# Patient Record
Sex: Male | Born: 1985 | Race: White | Hispanic: No | Marital: Single | Smoking: Current every day smoker
Health system: Southern US, Community
[De-identification: ages and names within clinical notes are randomized; demographics above are authoritative.]

## PROBLEM LIST (undated history)

## (undated) DIAGNOSIS — F952 Tourette's disorder: Secondary | ICD-10-CM

---

## 2012-02-02 ENCOUNTER — Emergency Department (HOSPITAL_COMMUNITY)
Admission: EM | Admit: 2012-02-02 | Discharge: 2012-02-02 | Disposition: A | Discharge status: Home / Self Care | Payer: Self-pay | Attending: Emergency Medicine | Admitting: Emergency Medicine

## 2012-02-02 ENCOUNTER — Emergency Department (HOSPITAL_COMMUNITY): Payer: Self-pay

## 2012-02-02 ENCOUNTER — Encounter (HOSPITAL_COMMUNITY): Payer: Self-pay | Admitting: Emergency Medicine

## 2012-02-02 DIAGNOSIS — R002 Palpitations: Secondary | ICD-10-CM | POA: Insufficient documentation

## 2012-02-02 DIAGNOSIS — R0789 Other chest pain: Secondary | ICD-10-CM | POA: Insufficient documentation

## 2012-02-02 LAB — BASIC METABOLIC PANEL
BUN: 8 mg/dL (ref 6–23)
Chloride: 106 mEq/L (ref 96–112)
GFR calc Af Amer: >90 mL/min (ref >90)
GFR calc non Af Amer: >90 mL/min (ref >90)
Glucose, Bld: 93 mg/dL (ref 70–99)
Potassium: 3.3 mEq/L — ABNORMAL LOW (ref 3.5–5.1)
Sodium: 142 mEq/L (ref 135–145)

## 2012-02-02 LAB — CBC
HCT: 47.6 % (ref 39.0–52.0)
Hemoglobin: 17.2 g/dL — ABNORMAL HIGH (ref 13.0–17.0)
WBC: 9.5 10*3/uL (ref 4.0–10.5)

## 2012-02-02 LAB — POCT I-STAT TROPONIN I

## 2012-02-02 NOTE — ED Notes (Signed)
Pt A.O. X 4. Vitals stable. NAD. Skin warm, dry, intact. Verbalized understanding of diagnosis, d/c teaching, and warning signs indicating need to seek additional treatment. Able to locate contact information to make follow up appointment. Ambulatory. No further questions at this time. Mother at bedside.

## 2012-02-02 NOTE — ED Notes (Signed)
Patient transported to X-ray 

## 2012-02-02 NOTE — ED Notes (Signed)
C/o intermittent stinging to L chest earlier today.  Reports intermittent episodes of irregular HR since Tuesday that is not associated with activity. Denies sob, nausea, vomiting, or any other symptoms.

## 2012-02-02 NOTE — ED Provider Notes (Signed)
History     CSN: [id number]  Arrival date & time 02/02/12  2007   First MD Initiated Contact with Patient 02/02/12 2123      Chief Complaint  Patient presents with  . Chest Pain    (Consider location/radiation/quality/duration/timing/severity/associated sxs/prior treatment) Patient is a 26 y.o. male presenting with chest pain. The history is provided by the patient.  Chest Pain The chest pain began 3 - 5 days ago. Chest pain occurs frequently. The chest pain is worsening. Primary symptoms include palpitations. Pertinent negatives for primary symptoms include no fever, no shortness of breath, no cough, no abdominal pain and no nausea.  The palpitations did not occur with shortness of breath.  Associated symptoms comments: He is concerned because he is having sensations of skipping beats that have become more frequent over the last 3 days. No fever, cough, SOB or chest pain. He denies cocaine use. There have been no identified aggravating factors or alleviating factors..     History reviewed. No pertinent past medical history.  History reviewed. No pertinent past surgical history.  History reviewed. No pertinent family history.  History  Substance Use Topics  . Smoking status: Current Every Day Smoker  . Smokeless tobacco: Not on file  . Alcohol Use: Yes      Review of Systems  Constitutional: Negative for fever.  HENT: Negative for neck pain.   Respiratory: Negative for cough and shortness of breath.   Cardiovascular: Positive for palpitations. Negative for chest pain.  Gastrointestinal: Negative for nausea and abdominal pain.    Allergies  Sulfa antibiotics  Home Medications  No current outpatient prescriptions on file.  BP 136/76  Pulse 65  Temp 98.3 F (36.8 C) (Oral)  Resp 20  SpO2 100%  Physical Exam  Constitutional: He is oriented to person, place, and time. He appears well-developed and well-nourished.  HENT:  Head: Normocephalic.  Neck: Normal  range of motion. Neck supple.  Cardiovascular: Normal rate and regular rhythm.   No murmur heard. Pulmonary/Chest: Effort normal and breath sounds normal. He has no wheezes. He has no rales. He exhibits no tenderness.  Abdominal: Soft. Bowel sounds are normal. There is no tenderness. There is no rebound and no guarding.  Musculoskeletal: Normal range of motion. He exhibits no edema.  Neurological: He is alert and oriented to person, place, and time.  Skin: Skin is warm and dry. No rash noted.  Psychiatric: He has a normal mood and affect.    ED Course  Procedures (including critical care time)  Labs Reviewed  CBC - Abnormal; Notable for the following:    Hemoglobin 17.2 (*)     MCHC 36.1 (*)     All other components within normal limits  BASIC METABOLIC PANEL - Abnormal; Notable for the following:    Potassium 3.3 (*)     All other components within normal limits  POCT I-STAT TROPONIN I   Results for orders placed during the hospital encounter of 02/02/12  CBC      Component Value Range   WBC 9.5  4.0 - 10.5 K/uL   RBC 5.32  4.22 - 5.81 MIL/uL   Hemoglobin 17.2 (*) 13.0 - 17.0 g/dL   HCT 65.7  84.6 - 96.2 %   MCV 89.5  78.0 - 100.0 fL   MCH 32.3  26.0 - 34.0 pg   MCHC 36.1 (*) 30.0 - 36.0 g/dL   RDW 95.2  84.1 - 32.4 %   Platelets 216  150 - 400  K/uL  BASIC METABOLIC PANEL      Component Value Range   Sodium 142  135 - 145 mEq/L   Potassium 3.3 (*) 3.5 - 5.1 mEq/L   Chloride 106  96 - 112 mEq/L   CO2 25  19 - 32 mEq/L   Glucose, Bld 93  70 - 99 mg/dL   BUN 8  6 - 23 mg/dL   Creatinine, Ser 9.81  0.50 - 1.35 mg/dL   Calcium 9.6  8.4 - 19.1 mg/dL   GFR calc non Af Amer >90  >90 mL/min   GFR calc Af Amer >90  >90 mL/min  POCT I-STAT TROPONIN I      Component Value Range   Troponin i, poc 0.00  0.00 - 0.08 ng/mL   Comment 3            Dg Chest 2 View  02/02/2012  *RADIOLOGY REPORT*  Clinical Data: Chest tightness.  Current smoker.  CHEST - 2 VIEW  Comparison: None.   Findings: Pulmonary hyperinflation which could represent emphysema or asthma or air trapping. The heart size and pulmonary vascularity are normal. The lungs appear clear and expanded without focal air space disease or consolidation. No blunting of the costophrenic angles.  No pneumothorax.  Mediastinal contours appear intact.  IMPRESSION: Pulmonary hyperinflation.  No evidence of active pulmonary disease.   Original Report Authenticated By: Marlon Pel, M.D.    Date: 02/02/2012  Rate: 79  Rhythm: normal sinus rhythm and sinus arrhythmia  QRS Axis: normal  Intervals: normal  ST/T Wave abnormalities: normal  Conduction Disutrbances:none  Narrative Interpretation:   Old EKG Reviewed: none available   No results found.   No diagnosis found.  1. Palpitations.  MDM  Normal EKG, clear CXR. Will refer to cardiology to consider benefits of monitoring.         Rodena Medin, PA-C 02/02/12 2238

## 2012-02-03 NOTE — ED Provider Notes (Signed)
Medical screening examination/treatment/procedure(s) were performed by non-physician practitioner and as supervising physician I was immediately available for consultation/collaboration.  Juliet Rude. Rubin Payor, MD 02/03/12 [phone]

## 2013-02-17 ENCOUNTER — Emergency Department (HOSPITAL_COMMUNITY)
Admission: EM | Admit: 2013-02-17 | Discharge: 2013-02-17 | Disposition: A | Discharge status: Home / Self Care | Payer: Self-pay | Attending: Emergency Medicine | Admitting: Emergency Medicine

## 2013-02-17 ENCOUNTER — Encounter (HOSPITAL_COMMUNITY): Payer: Self-pay | Admitting: Emergency Medicine

## 2013-02-17 DIAGNOSIS — K047 Periapical abscess without sinus: Secondary | ICD-10-CM | POA: Insufficient documentation

## 2013-02-17 DIAGNOSIS — K002 Abnormalities of size and form of teeth: Secondary | ICD-10-CM | POA: Insufficient documentation

## 2013-02-17 DIAGNOSIS — F17200 Nicotine dependence, unspecified, uncomplicated: Secondary | ICD-10-CM | POA: Insufficient documentation

## 2013-02-17 DIAGNOSIS — K029 Dental caries, unspecified: Secondary | ICD-10-CM | POA: Insufficient documentation

## 2013-02-17 MED ORDER — HYDROCODONE-ACETAMINOPHEN 5-325 MG PO TABS: 1.0000 | ORAL_TABLET | ORAL | Status: DC | PRN

## 2013-02-17 MED ORDER — PENICILLIN V POTASSIUM 500 MG PO TABS: 500.0000 mg | ORAL_TABLET | Freq: Four times a day (QID) | ORAL | Status: AC

## 2013-02-17 NOTE — ED Provider Notes (Signed)
Medical screening examination/treatment/procedure(s) were performed by non-physician practitioner and as supervising physician I was immediately available for consultation/collaboration.  EKG Interpretation   None         Natonya Finstad H Ryelee Albee, MD 02/17/13 2234 

## 2013-02-17 NOTE — ED Provider Notes (Signed)
CSN: [id number]     Arrival date & time 02/17/13  1912 History  This chart was scribed for non-physician practitioner, Sharilyn Sites, PA-C working with Richardean Canal, MD by Greggory Stallion, ED scribe. This patient was seen in room TR07C/TR07C and the patient's care was started at 8:07 PM.   Chief Complaint  Patient presents with  . Dental Pain   The history is provided by the patient. No language interpreter was used.   HPI Comments: Joshua Lin is a 27 y.o. male who presents to the Emergency Department complaining of right upper dental pain that started 3 days ago. Pt started seeing mild swelling to right upper face yesterday but it has worsened since then. He has taken ibuprofen with no relief. Pt states chewing worsens the pain. He denies fever, chills, sweats, or difficulty swallowing.  Pt does not currently have a dentist.  History reviewed. No pertinent past medical history. History reviewed. No pertinent past surgical history. No family history on file. History  Substance Use Topics  . Smoking status: Current Every Day Smoker  . Smokeless tobacco: Not on file  . Alcohol Use: Yes    Review of Systems  Constitutional: Negative for fever, chills and diaphoresis.  HENT: Positive for dental problem and facial swelling. Negative for trouble swallowing.   All other systems reviewed and are negative.    Allergies  Sulfa antibiotics  Home Medications  No current outpatient prescriptions on file.  BP 131/77  Pulse 96  Temp(Src) 98.2 F (36.8 C) (Oral)  Resp 16  Wt 152 lb 3.2 oz (69.037 kg)  SpO2 98%  Physical Exam  Nursing note and vitals reviewed. Constitutional: He is oriented to person, place, and time. He appears well-developed and well-nourished. No distress.  HENT:  Head: Normocephalic and atraumatic.  Mouth/Throat: Uvula is midline, oropharynx is clear and moist and mucous membranes are normal. No oral lesions. No trismus in the jaw. Abnormal dentition. Dental caries  present. No oropharyngeal exudate, posterior oropharyngeal edema, posterior oropharyngeal erythema or tonsillar abscesses.    Teeth largely in poor dentition, right upper molars with extensive cavities, surrounding gingiva swollen and erythematous, some swelling of face along right maxilla, handling secretions appropriately, no trismus  Eyes: Conjunctivae and EOM are normal. Pupils are equal, round, and reactive to light.  Neck: Normal range of motion.  Cardiovascular: Normal rate, regular rhythm and normal heart sounds.   Pulmonary/Chest: Effort normal and breath sounds normal. No respiratory distress. He has no wheezes.  Musculoskeletal: Normal range of motion.  Neurological: He is alert and oriented to person, place, and time.  Skin: Skin is warm and dry. He is not diaphoretic.  Psychiatric: He has a normal mood and affect.    ED Course  Procedures (including critical care time)  DIAGNOSTIC STUDIES: Oxygen Saturation is 98% on RA, normal by my interpretation.    COORDINATION OF CARE: 8:10 PM-Discussed treatment plan which includes antibiotics and pain medication with pt at bedside and pt agreed to plan. Advised pt to follow up with a dentist.   Labs Review Labs Reviewed - No data to display Imaging Review No results found.  EKG Interpretation   None       MDM   1. Dental abscess    Dental pain with signs of dental abscess. Airway patent, no difficulty handling secretions.  Patient will be started on amoxicillin and Vicodin. Followup with dentist, referrals given. Discussed plan with patient, he agreed. Return precautions advised.  I personally performed the  services described in this documentation, which was scribed in my presence. The recorded information has been reviewed and is accurate.   Garlon Hatchet, PA-C 02/17/13 2132

## 2013-02-17 NOTE — ED Notes (Signed)
Patient presents with right upper molar pain since yesterday and swelling to gingival area on right upper side this AM. Patient has not seen a dentist.  Denies SOB. Reports taking ibuprofen several hours ago without relief.

## 2013-09-13 ENCOUNTER — Emergency Department (HOSPITAL_COMMUNITY): Payer: No Typology Code available for payment source

## 2013-09-13 ENCOUNTER — Emergency Department (HOSPITAL_COMMUNITY)
Admission: EM | Admit: 2013-09-13 | Discharge: 2013-09-13 | Disposition: A | Discharge status: Home / Self Care | Payer: No Typology Code available for payment source | Attending: Emergency Medicine | Admitting: Emergency Medicine

## 2013-09-13 ENCOUNTER — Other Ambulatory Visit (HOSPITAL_COMMUNITY): Payer: No Typology Code available for payment source

## 2013-09-13 ENCOUNTER — Encounter (HOSPITAL_COMMUNITY): Payer: Self-pay | Admitting: Emergency Medicine

## 2013-09-13 DIAGNOSIS — F17200 Nicotine dependence, unspecified, uncomplicated: Secondary | ICD-10-CM | POA: Insufficient documentation

## 2013-09-13 DIAGNOSIS — K5900 Constipation, unspecified: Secondary | ICD-10-CM | POA: Insufficient documentation

## 2013-09-13 DIAGNOSIS — R82998 Other abnormal findings in urine: Secondary | ICD-10-CM | POA: Insufficient documentation

## 2013-09-13 DIAGNOSIS — R630 Anorexia: Secondary | ICD-10-CM | POA: Insufficient documentation

## 2013-09-13 LAB — CBC WITH DIFFERENTIAL/PLATELET
BASOS PCT: 0 % (ref 0–1)
Basophils Absolute: 0 10*3/uL (ref 0.0–0.1)
EOS ABS: 0 10*3/uL (ref 0.0–0.7)
Eosinophils Relative: 0 % (ref 0–5)
HCT: 43.6 % (ref 39.0–52.0)
HEMOGLOBIN: 15.6 g/dL (ref 13.0–17.0)
Lymphocytes Relative: 12 % (ref 12–46)
Lymphs Abs: 1.3 10*3/uL (ref 0.7–4.0)
MCH: 31.8 pg (ref 26.0–34.0)
MCHC: 35.8 g/dL (ref 30.0–36.0)
MCV: 88.8 fL (ref 78.0–100.0)
MONO ABS: 0.8 10*3/uL (ref 0.1–1.0)
MONOS PCT: 8 % (ref 3–12)
NEUTROS PCT: 80 % — AB (ref 43–77)
Neutro Abs: 8.4 10*3/uL — ABNORMAL HIGH (ref 1.7–7.7)
Platelets: 204 10*3/uL (ref 150–400)
RBC: 4.91 MIL/uL (ref 4.22–5.81)
RDW: 12.1 % (ref 11.5–15.5)
WBC: 10.6 10*3/uL — ABNORMAL HIGH (ref 4.0–10.5)

## 2013-09-13 LAB — BASIC METABOLIC PANEL
BUN: 10 mg/dL (ref 6–23)
CALCIUM: 9.3 mg/dL (ref 8.4–10.5)
CO2: 24 mEq/L (ref 19–32)
CREATININE: 0.89 mg/dL (ref 0.50–1.35)
Chloride: 101 mEq/L (ref 96–112)
Glucose, Bld: 86 mg/dL (ref 70–99)
POTASSIUM: 3.6 meq/L — AB (ref 3.7–5.3)
Sodium: 141 mEq/L (ref 137–147)

## 2013-09-13 LAB — URINALYSIS, ROUTINE W REFLEX MICROSCOPIC
Glucose, UA: NEGATIVE mg/dL
Hgb urine dipstick: NEGATIVE
Leukocytes, UA: NEGATIVE
NITRITE: NEGATIVE
Protein, ur: NEGATIVE mg/dL
Specific Gravity, Urine: 1.03 (ref 1.005–1.030)
UROBILINOGEN UA: 1 mg/dL (ref 0.0–1.0)
pH: 6 (ref 5.0–8.0)

## 2013-09-13 NOTE — ED Notes (Addendum)
Pt brought back to room with family in tow; pt getting undressed and into a gown at this time; family at bedside; Felipa Eth, RN aware

## 2013-09-13 NOTE — ED Provider Notes (Signed)
CSN: [id number]     Arrival date & time 09/13/13  1814 History   First MD Initiated Contact with Patient 09/13/13 1838     Chief Complaint  Patient presents with  . Abdominal Pain     (Consider location/radiation/quality/duration/timing/severity/associated sxs/prior Treatment) Patient is a 28 y.o. male presenting with abdominal pain. The history is provided by the patient and a relative.  Abdominal Pain  He has had intermittent, crampy, abdominal pain, for one week. He has had decreased stooling during this time. He has anorexia without nausea or vomiting. His urine was dark in color today. He is not using anything, to help the symptoms. He has never had this previously There no other known modifying factors.  History reviewed. No pertinent past medical history. History reviewed. No pertinent past surgical history. No family history on file. History  Substance Use Topics  . Smoking status: Current Every Day Smoker  . Smokeless tobacco: Not on file  . Alcohol Use: Yes    Review of Systems  Gastrointestinal: Positive for abdominal pain.  All other systems reviewed and are negative.     Allergies  Sulfa antibiotics  Home Medications   Prior to Admission medications   Medication Sig Start Date End Date Taking? Authorizing Provider  acetaminophen (TYLENOL) 500 MG tablet Take 1,000 mg by mouth every 6 (six) hours as needed for mild pain.   Yes Historical Provider, MD   BP 109/76  Pulse 74  Temp(Src) 98.1 F (36.7 C) (Oral)  Resp 16  Wt 155 lb (70.308 kg)  SpO2 99% Physical Exam  Nursing note and vitals reviewed. Constitutional: He is oriented to person, place, and time. He appears well-developed and well-nourished.  HENT:  Head: Normocephalic and atraumatic.  Right Ear: External ear normal.  Left Ear: External ear normal.  Eyes: Conjunctivae and EOM are normal. Pupils are equal, round, and reactive to light.  Neck: Normal range of motion and phonation normal. Neck  supple.  Cardiovascular: Normal rate, regular rhythm, normal heart sounds and intact distal pulses.   Pulmonary/Chest: Effort normal and breath sounds normal. He exhibits no bony tenderness.  Abdominal: Soft. He exhibits no mass. There is tenderness (mild bilateral lower quadrant tenderness.). There is no rebound and no guarding.  Musculoskeletal: Normal range of motion.  Neurological: He is alert and oriented to person, place, and time. No cranial nerve deficit or sensory deficit. He exhibits normal muscle tone. Coordination normal.  Skin: Skin is warm, dry and intact.  Psychiatric: He has a normal mood and affect. His behavior is normal. Judgment and thought content normal.    ED Course  Procedures (including critical care time) 18:50- discussed the findings with the patient and his mother. He, clinically has constipation. He would like to have some testing done for reassurance prior to being dispositioned.  Medications - No data to display  No data found.   8:11 PM Reevaluation with update and discussion. After initial assessment and treatment, an updated evaluation reveals He remains comfortable. Imaging pending.Flint Melter     Labs Review Labs Reviewed  CBC WITH DIFFERENTIAL - Abnormal; Notable for the following:    WBC 10.6 (*)    Neutrophils Relative % 80 (*)    Neutro Abs 8.4 (*)    All other components within normal limits  BASIC METABOLIC PANEL - Abnormal; Notable for the following:    Potassium 3.6 (*)    All other components within normal limits  URINALYSIS, ROUTINE W REFLEX MICROSCOPIC - Abnormal;  Notable for the following:    Color, Urine AMBER (*)    Bilirubin Urine SMALL (*)    Ketones, ur >80 (*)    All other components within normal limits    Imaging Review Dg Abd 1 View  09/13/2013   CLINICAL DATA:  Abdominal pain 6 days  EXAM: ABDOMEN - 1 VIEW  COMPARISON:  None.  FINDINGS: No dilated loops of large or small bowel. There is gas in the rectum. No  pathologic calcifications. No organomegaly.  IMPRESSION: No abdominal abnormality by radiograph.   Electronically Signed   By: Genevive BiStewart  Edmunds M.D.   On: 09/13/2013 20:50     EKG Interpretation None      MDM   Final diagnoses:  Constipation    Eval c/w clinical constipation. Doubt Colitis, SBI or metabolic instability.   Nursing Notes Reviewed/ Care Coordinated Applicable Imaging Reviewed Interpretation of Laboratory Data incorporated into ED treatment  The patient appears reasonably screened and/or stabilized for discharge and I doubt any other medical condition or other [facility] requiring further screening, evaluation, or treatment in the ED at this time prior to discharge.  Plan: Home Medications- Miralax; Home Treatments- rest; return here if the recommended treatment, does not improve the symptoms; Recommended follow up- PCP of choice prn    Flint MelterElliott L Maysen Sudol, MD 09/15/13 1123

## 2013-09-13 NOTE — ED Notes (Signed)
The pt has had generalized abd pain since last Sunday no n v or diarrhea.  The pain went away then returned at the end of this week

## 2013-09-13 NOTE — Discharge Instructions (Signed)
Use MiraLax once or twice a day, for one or 2 weeks. Increase the amount of fiber in your diet. Drink plenty of water each day (30-40 ounces).     Constipation, Adult Constipation is when a person has fewer than 3 bowel movements a week; has difficulty having a bowel movement; or has stools that are dry, hard, or larger than normal. As people grow older, constipation is more common. If you try to fix constipation with medicines that make you have a bowel movement (laxatives), the problem may get worse. Long-term laxative use may cause the muscles of the colon to become weak. A low-fiber diet, not taking in enough fluids, and taking certain medicines may make constipation worse. CAUSES   Certain medicines, such as antidepressants, pain medicine, iron supplements, antacids, and water pills.   Certain diseases, such as diabetes, irritable bowel syndrome (IBS), thyroid disease, or depression.   Not drinking enough water.   Not eating enough fiber-rich foods.   Stress or travel.  Lack of physical activity or exercise.  Not going to the restroom when there is the urge to have a bowel movement.  Ignoring the urge to have a bowel movement.  Using laxatives too much. SYMPTOMS   Having fewer than 3 bowel movements a week.   Straining to have a bowel movement.   Having hard, dry, or larger than normal stools.   Feeling full or bloated.   Pain in the lower abdomen.  Not feeling relief after having a bowel movement. DIAGNOSIS  Your caregiver will take a medical history and perform a physical exam. Further testing may be done for severe constipation. Some tests may include:   A barium enema X-ray to examine your rectum, colon, and sometimes, your small intestine.  A sigmoidoscopy to examine your lower colon.  A colonoscopy to examine your entire colon. TREATMENT  Treatment will depend on the severity of your constipation and what is causing it. Some dietary treatments  include drinking more fluids and eating more fiber-rich foods. Lifestyle treatments may include regular exercise. If these diet and lifestyle recommendations do not help, your caregiver may recommend taking over-the-counter laxative medicines to help you have bowel movements. Prescription medicines may be prescribed if over-the-counter medicines do not work.  HOME CARE INSTRUCTIONS   Increase dietary fiber in your diet, such as fruits, vegetables, whole grains, and beans. Limit high-fat and processed sugars in your diet, such as [place] fries, hamburgers, cookies, candies, and soda.   A fiber supplement may be added to your diet if you cannot get enough fiber from foods.   Drink enough fluids to keep your urine clear or pale yellow.   Exercise regularly or as directed by your caregiver.   Go to the restroom when you have the urge to go. Do not hold it.  Only take medicines as directed by your caregiver. Do not take other medicines for constipation without talking to your caregiver first. SEEK IMMEDIATE MEDICAL CARE IF:   You have bright red blood in your stool.   Your constipation lasts for more than 4 days or gets worse.   You have abdominal or rectal pain.   You have thin, pencil-like stools.  You have unexplained weight loss. MAKE SURE YOU:   Understand these instructions.  Will watch your condition.  Will get help right away if you are not doing well or get worse. Document Released: 12/31/2003 Document Revised: 06/26/2011 Document Reviewed: 01/13/2013 [facility] Patient Information 2014 [place], [place].    Fiber  Content in Foods Drinking plenty of fluids and consuming foods high in fiber can help with constipation. See the list below for the fiber content of some common foods. Starches and Grains / Dietary Fiber (g)  Cheerios, 1 cup / 3 g  Kellogg's Corn Flakes, 1 cup / 0.7 g  Rice Krispies, 1  cup / 0.3 g  Quaker Oat Life Cereal,  cup / 2.1 g  Oatmeal,  instant (cooked),  cup / 2 g  Kellogg's Frosted Mini Wheats, 1 cup / 5.1 g  Rice, brown, long-grain (cooked), 1 cup / 3.5 g  Rice, white, long-grain (cooked), 1 cup / 0.6 g  Macaroni, cooked, enriched, 1 cup / 2.5 g Legumes / Dietary Fiber (g)  Beans, baked, canned, plain or vegetarian,  cup / 5.2 g  Beans, kidney, canned,  cup / 6.8 g  Beans, pinto, dried (cooked),  cup / 7.7 g  Beans, pinto, canned,  cup / 5.5 g Breads and Crackers / Dietary Fiber (g)  Graham crackers, plain or honey, 2 squares / 0.7 g  Saltine crackers, 3 squares / 0.3 g  Pretzels, plain, salted, 10 pieces / 1.8 g  Bread, whole-wheat, 1 slice / 1.9 g  Bread, white, 1 slice / 0.7 g  Bread, raisin, 1 slice / 1.2 g  Bagel, plain, 3 oz / 2 g  Tortilla, flour, 1 oz / 0.9 g  Tortilla, corn, 1 small / 1.5 g  Bun, hamburger or hotdog, 1 small / 0.9 g Fruits / Dietary Fiber (g)  Apple, raw with skin, 1 medium / 4.4 g  Applesauce, sweetened,  cup / 1.5 g  Banana,  medium / 1.5 g  Grapes, 10 grapes / 0.4 g  Orange, 1 small / 2.3 g  Raisin, 1.5 oz / 1.6 g  Melon, 1 cup / 1.4 g Vegetables / Dietary Fiber (g)  Green beans, canned,  cup / 1.3 g  Carrots (cooked),  cup / 2.3 g  Broccoli (cooked),  cup / 2.8 g  Peas, frozen (cooked),  cup / 4.4 g  Potatoes, mashed,  cup / 1.6 g  Lettuce, 1 cup / 0.5 g  Corn, canned,  cup / 1.6 g  Tomato,  cup / 1.1 g Document Released: 08/20/2006 Document Revised: 06/26/2011 Document Reviewed: 10/15/2006 ExitCare Patient Information 2014 [place], [place].

## 2013-09-13 NOTE — ED Provider Notes (Signed)
2100 - Patient care assumed form Dr. Effie Shy at 2000 at shift change. Imaging pending to evaluate for constipation. Symptoms include generalized abdominal cramping and decreased BMs. Plan discussed with Dr. Effie Shy which includes discharge with PCP f/u if imaging non-acute. Imaging reviewed which shows no dilated bowel loops. No organomegaly. Labs reviewed which are reassuring. Patient stable for d/c with instruction to add Miralax, increase his daily fiber and f/u with his PCP. Patient agreeable to plan with no unaddressed concerns.  Filed Vitals:   09/13/13 1942 09/13/13 1955 09/13/13 2000 09/13/13 2050  BP: 113/70 114/74 109/65 107/65  Pulse: 77 79 81 75  Temp: 97.9 F (36.6 C)     TempSrc: Oral     Resp: 18 17  16   Weight:      SpO2: 98% 97% 97% 97%   Dg Abd 1 View  09/13/2013   CLINICAL DATA:  Abdominal pain 6 days  EXAM: ABDOMEN - 1 VIEW  COMPARISON:  None.  FINDINGS: No dilated loops of large or small bowel. There is gas in the rectum. No pathologic calcifications. No organomegaly.  IMPRESSION: No abdominal abnormality by radiograph.   Electronically Signed   By: Genevive Bi M.D.   On: 09/13/2013 20:50      Antony Madura, PA-C 09/13/13 2100

## 2013-09-14 NOTE — ED Provider Notes (Signed)
Pt of Dr Neoma Laming, MD 09/14/13 [phone]

## 2014-02-16 ENCOUNTER — Emergency Department (HOSPITAL_COMMUNITY)
Admission: EM | Admit: 2014-02-16 | Discharge: 2014-02-16 | Disposition: A | Discharge status: Home / Self Care | Payer: No Typology Code available for payment source | Attending: Emergency Medicine | Admitting: Emergency Medicine

## 2014-02-16 ENCOUNTER — Encounter (HOSPITAL_COMMUNITY): Payer: Self-pay | Admitting: Emergency Medicine

## 2014-02-16 DIAGNOSIS — M436 Torticollis: Secondary | ICD-10-CM | POA: Diagnosis not present

## 2014-02-16 DIAGNOSIS — R200 Anesthesia of skin: Secondary | ICD-10-CM | POA: Insufficient documentation

## 2014-02-16 DIAGNOSIS — Z720 Tobacco use: Secondary | ICD-10-CM | POA: Diagnosis not present

## 2014-02-16 DIAGNOSIS — M542 Cervicalgia: Secondary | ICD-10-CM | POA: Diagnosis present

## 2014-02-16 DIAGNOSIS — M5412 Radiculopathy, cervical region: Secondary | ICD-10-CM | POA: Diagnosis not present

## 2014-02-16 HISTORY — DX: Tourette's disorder: F95.2

## 2014-02-16 MED ORDER — NAPROXEN 500 MG PO TABS: 500.0000 mg | ORAL_TABLET | Freq: Two times a day (BID) | ORAL | Status: DC

## 2014-02-16 MED ORDER — HYDROCODONE-ACETAMINOPHEN 5-325 MG PO TABS: 1.0000 | ORAL_TABLET | Freq: Four times a day (QID) | ORAL | Status: DC | PRN

## 2014-02-16 NOTE — ED Provider Notes (Signed)
CSN: [id number]     Arrival date & time 02/16/14  1736 History  This chart was scribed for a non-physician practitioner, Mellody DrownLauren Marcella Charlson, PA-C working with Warnell Foresterrey Wofford, MD by [place]azil[place] Peace, ED Scribe. The patient was seen in TR05C/TR05C. The patient's care was started at 8:15 PM.     Chief Complaint  Patient presents with  . Neck Pain  . Arm Pain      HPI Comments: Joshua Lin is a 28 y.o. male who presents to the Emergency Department complaining of neck pain and stiffness onset 2 weeks. Pt states he has been dealing with problem over past 1 year but explains pain has gotten worse. He reports pain radiates into his his right arm and experience numbness. He notes taking Ibuprofen, intermittently to address symptoms which he states had been effective in the past but has not worked since recent flare up. He denies any neck injuries to his knowledge. Pt believes history of Tourette's and "twitching" may be responsible for current discomfort. Pt is right dominant. Pt does not have PCP.   Patient is a 28 y.o. male presenting with neck pain and arm pain. The history is provided by the patient. No language interpreter was used.  Neck Pain Associated symptoms: numbness   Associated symptoms: no weakness   Arm Pain      Past Medical History  Diagnosis Date  . Tourette disease    History reviewed. No pertinent past surgical history. History reviewed. No pertinent family history. History  Substance Use Topics  . Smoking status: Current Every Day Smoker  . Smokeless tobacco: Not on file  . Alcohol Use: Yes    Review of Systems  Musculoskeletal: Positive for neck pain and neck stiffness.  Skin: Negative for color change and wound.  Neurological: Positive for numbness. Negative for weakness.      Allergies  Sulfa antibiotics  Home Medications   Prior to Admission medications   Medication Sig Start Date End Date Taking? Authorizing Provider  acetaminophen (TYLENOL) 500 MG tablet  Take 1,000 mg by mouth every 6 (six) hours as needed for mild pain.    Historical Provider, MD   BP 107/78 mmHg  Pulse 72  Temp(Src) 98.2 F (36.8 C) (Oral)  Resp 18  Ht 6\' 2"  (1.88 m)  Wt 160 lb (72.576 kg)  BMI 20.53 kg/m2  SpO2 99% Physical Exam  Constitutional: He is oriented to person, place, and time. He appears well-developed and well-nourished. No distress.  HENT:  Head: Normocephalic and atraumatic.  Eyes: Conjunctivae and EOM are normal.  Neck: Neck supple. No spinous process tenderness present.  Cardiovascular: Normal rate and regular rhythm.   Pulses:      Radial pulses are 2+ on the right side, and 2+ on the left side.  Pulmonary/Chest: Effort normal. No respiratory distress.  Musculoskeletal: Normal range of motion.       Arms: Positive Spurling's test on the right. Negative Shoulder Abduction test. Normals sensation and equal grip strength bilaterally.   Neurological: He is alert and oriented to person, place, and time.  Skin: Skin is warm and dry.  Psychiatric: He has a normal mood and affect. His behavior is normal.  Nursing note and vitals reviewed.   ED Course  Procedures (including critical care time) Labs Review Labs Reviewed - No data to display  Imaging Review No results found.   EKG Interpretation None     Medications - No data to display  8:20 PM- Treatment plan was discussed with  patient and patient's grandmother who verbalizes understanding and agrees.   MDM   Final diagnoses:  Cervical radicular pain   Patient with non-traumatic right trapezius and neck pain for over one year, worsening. Neurovascularly intact. Positive Spurling's test, likely radicular pain. Plan to treat with anti-inflammatories, narcotic pain medication and follow-up with a specialist. Meds given in ED:  Medications - No data to display  Discharge Medication List as of 02/16/2014  8:23 PM    START taking these medications   Details  HYDROcodone-acetaminophen  (NORCO/VICODIN) 5-325 MG per tablet Take 1 tablet by mouth every 6 (six) hours as needed., Starting 02/16/2014, Until Discontinued, Print    naproxen (NAPROSYN) 500 MG tablet Take 1 tablet (500 mg total) by mouth 2 (two) times daily., Starting 02/16/2014, Until Discontinued, Print        I personally performed the services described in this documentation, which was scribed in my presence. The recorded information has been reviewed and is accurate.   Mellody DrownLauren Mccall Will, PA-C 02/16/14 2250  Warnell Foresterrey Wofford, MD 02/19/14 [phone]

## 2014-02-16 NOTE — ED Notes (Addendum)
Pt sts neck and right sided upper back pain with radiation to right arm x 1 year that is worse over last 2 weeks; pt sts hx of tourettes and thinks is causing spasm

## 2014-02-16 NOTE — ED Notes (Signed)
Pt A&OX4, ambulatory at d/c with steady gait, NAD 

## 2014-02-16 NOTE — Discharge Instructions (Signed)
Call for a follow up appointment with a Family or Primary Care Provider.  °Return if Symptoms worsen.   °Take medication as prescribed.  °Do not operate heavy machinery or drink alcohol while taking narcotic pain medication. ° ° °Emergency Department Resource Guide °1) Find a Doctor and Pay Out of Pocket °Although you won't have to find out who is covered by your insurance plan, it is a good idea to ask around and get recommendations. You will then need to call the office and see if the doctor you have chosen will accept you as a new patient and what types of options they offer for patients who are self-pay. Some doctors offer discounts or will set up payment plans for their patients who do not have insurance, but you will need to ask so you aren't surprised when you get to your appointment. ° °2) Contact Your Local Health Department °Not all health departments have doctors that can see patients for sick visits, but many do, so it is worth a call to see if yours does. If you don't know where your local health department is, you can check in your phone book. The CDC also has a tool to help you locate your state's health department, and many state websites also have listings of all of their local health departments. ° °3) Find a Walk-in Clinic °If your illness is not likely to be very severe or complicated, you may want to try a walk in clinic. These are popping up all over the country in pharmacies, drugstores, and shopping centers. They're usually staffed by nurse practitioners or physician assistants that have been trained to treat common illnesses and complaints. They're usually fairly quick and inexpensive. However, if you have serious medical issues or chronic medical problems, these are probably not your best option. ° °No Primary Care Doctor: °- Call Health Connect at  [phone] - they can help you locate a primary care doctor that  accepts your insurance, provides certain services, etc. °- Physician Referral  Service- 1-800-533-3463 ° °Chronic Pain Problems: °Organization         Address  Phone   Notes  °[facility]  ([phone] Patients need to be referred by their primary care doctor.  ° °Medication Assistance: °Organization         Address  Phone   Notes  °[place] [facility] [address] °[place], [place] [address] [phone]) [phone] --Must be a resident of [place] °-- Must have NO insurance coverage whatsoever (no Medicaid/ Medicare, etc.) °-- The pt. MUST have a primary care doctor that directs their care regularly and follows them in the community °  °[facility]ist  (866) 331-1348   °United Way  (888) 892-1162   ° °Agencies that provide inexpensive medical care: °Organization         Address  Phone   Notes  °[facility]  ([phone]) [phone]   °[facility]    ([phone]) [phone]   °[facility] [address] °[place], [place] [address] [phone]) [phone]   °[facility] [address], °[place] ([phone]) [phone]   °Planned Parenthood    (336) 373-0678   °[facility]    (336) 272-1050   °[facility] ° [address], [place] Phone:  (336) [phone], Fax:  ([phone] Hours of Operation:  9 am - 6 pm, M-F.  Also accepts Medicaid/Medicare and self-pay.  °  [place] for [place], [address], [place] Phone: [phone], Fax: [phone]. Hours of Operation:  8:30 am - 5:30 pm, M-F.  Also accepts Medicaid and self-pay.  [facility] [address] [place] Phone: [phone]   [place], [place], [place] [phone], Ext. [phone] Mondays & Thursdays: 7-9 AM.  First 15 patients are seen on a first come, first serve basis.    [place] Providers:  Organization         Address  Phone   Notes  [facility] [address], [place] [phone] Also accepts self-pay patients.  [facility] [address], [place]  [phone]   [place], [address], [place] [phone]   [facility] [address] [place] [phone]   Lucianne Lei [address], [place]   [phone] Only accepts [place] Access [place] patients after they have their name applied to their card.   Self-Pay (no insurance) in [facility]:  Organization         Address  Phone   Notes  Sickle Cell Patients, [facility] [place] [phone]   [facility] [place] [phone]   [facility] [place]  [place], [place], [place] [phone]   [facility]/Dr. Osei-Bonsu  [address], [place] or [address], [place] [phone] Phone number for both [place] and [place] locations is the same.  Urgent Medical and [facility] [address] [place] [phone]   [facility] [address] [place] or [address] [phone]   [facility] [address], [place] [phone], phone; [phone], fax Sees patients 1st and 3rd Saturday of every month.  Must not qualify for public or private insurance (i.e. Medicaid, Medicare, [place] Health Choice, Veterans' Benefits)  Household income should be no more than 200% of the poverty level The clinic cannot treat you if you are pregnant or think you are pregnant  Sexually transmitted diseases are not treated at the clinic.    Dental Care: Organization         Address  Phone  Notes  [facility] of [facility] [place] [phone] Accepts children up to age 23 who are enrolled in  [place] or [place]; pregnant women with a Medicaid card; and children who have applied for Medicaid or [place] Health Choice, but were declined, whose parents can pay a reduced fee at time of service.  [facility] Department of [facility]  [address], [place] [phone] Accepts children up to age 30 who are enrolled in [place] or [place]; pregnant women with a Medicaid card; and children who have applied for Medicaid or [place] Health Choice, but were declined, whose parents can pay a reduced fee at time of service.  [facility]  [place] [phone] Patients are seen by appointment only. Walk-ins are not accepted. [place] will see patients 70 years of age and older. Monday - Tuesday (8am-5pm) Most Wednesdays (8:30-5pm) $30 per visit,  cash only  [employer] [facility] [employer] PRO[facility]  [address], [place] [phone] Patients are seen by appointment only. Walk-ins are not accepted. [place] will see patients 68 years of age and older. One Wednesday Evening (Monthly: Volunteer Based).  $30 per visit, cash only  [place]  [phone] for adults; Children under age 32, call Graduate Pediatric Dentistry at [phone]. Children aged 36-14, please call [phone] to request a pediatric application.  Dental services are provided in all areas of dental care including fillings, crowns and bridges, complete and partial dentures, implants, gum treatment, root canals, and extractions. Preventive care is also provided. Treatment is provided to both adults and children. Patients are selected via a lottery and there is often a waiting list.   [facility] [address] [place]  ([phone] [email or web address]   [facility] [address] [place], [place] [phone], Ext. [phone] Second and Fourth Thursday of each month, opens at 6:30  AM; Clinic ends at 9 AM.  Patients are seen on a first-come first-served basis, and a limited number are seen during each clinic.   [facility]  [address] [place] Danker [place], [place] [phone]   Eligibility Requirements You must have lived in [place], [place], or [place] counties for at least the last three months.   You cannot be eligible for state or federal sponsored [employer], including [employer], [place], or [employer].   You generally cannot be eligible for healthcare insurance through your employer.    How to apply: Eligibility screenings are held every Tuesday and Wednesday afternoon from 1:00 pm until 4:00 pm. You do not need an appointment for the interview!  [facility] [address] [place], [place]   [place]  [place] Department  [place]  [phone]    Behavioral Health Resources in the Community: Intensive Outpatient Programs Organization         Address  Phone  Notes  [place]jou Flanagan. [address], [place], [place] [phone]   [facility] Outpatient [address] [place], [place]   ADS: [facility] [address] [place], [place]   [place] [address],  [place], [place] or [phone]   Substance Abuse Resources Organization         Address  Phone  Notes  Alcohol and Drug Services  8256994078   [place]  [phone]   [facility] [place]   Chinita Pester  [phone]   Residential & Outpatient Substance Abuse Program  [phone]   Psychological Services Organization         Address  Phone  Notes  [facility] [place]  [place]  [phone]   [place] [address] [place] or  [phone]    Mobile Crisis Teams Organization         Address  Phone  Notes  [facility], Mobile Crisis Care Unit  [phone]   [facility]  [address] [place], [place]   [place] [address], [place] [phone]    Self-Help/Support Groups Organization         Address  Phone  Notes  [facility] [place] - variety of support groups  [place] Call for more information  Narcotics Anonymous (NA), [facility]aring [facility] [address], [employer] [place]  2 meetings at this location   Special educational needs teacher         Address  Phone  Notes  [facility] [place],    [place]  [phone]   [facility]  [address] [place] [phone], [place], [place]   [facility] [place], [place] [phone] Admissions: 8am-3pm M-F  Incentives Substance [place] [address]    [place], [place] [phone]   [facility] [address] [place], [place], [place]   [facility] [address],  [place], [place]   [facility] - Intensive Outpatient [address] Kristeen Mans [address], [place], [place]   [facility] ([place] [address].,  [place], [place] [phone] or [phone]   [facility] ([facility]S) [address], [place], [place] Accepts Medicaid  [facility] [address]  [place] 1-8254049705 Substance Abuse/Addiction Treatment   [facility] Organization         Address  Phone  Notes  [facility]  [phone]   Domenic Schwab, PhD [address] Arlis Porta [place], [place]   [phone] or [phone]   [place], [place] [phone]   [facility] [address],  [place], [place] [phone] Insurance/Medicaid/sponsorship through [facility] and Families [address] [place]                                    [place], [place] [phone] [place] [address] [place], [place] [phone]    Dr. Adele Schilder  [phone]   [facility] Dept. 1) [address] [place] 2) [place] 3)  [place], [place] [phone]  [phone]   [place] [phone] or [phone] (After Hours)

## 2014-04-28 ENCOUNTER — Encounter (HOSPITAL_COMMUNITY): Payer: Self-pay | Admitting: Adult Health

## 2014-04-28 ENCOUNTER — Emergency Department (HOSPITAL_COMMUNITY)
Admission: EM | Admit: 2014-04-28 | Discharge: 2014-04-28 | Disposition: A | Discharge status: Home / Self Care | Payer: No Typology Code available for payment source | Attending: Emergency Medicine | Admitting: Emergency Medicine

## 2014-04-28 DIAGNOSIS — M542 Cervicalgia: Secondary | ICD-10-CM | POA: Insufficient documentation

## 2014-04-28 DIAGNOSIS — Z720 Tobacco use: Secondary | ICD-10-CM | POA: Insufficient documentation

## 2014-04-28 DIAGNOSIS — M25511 Pain in right shoulder: Secondary | ICD-10-CM | POA: Insufficient documentation

## 2014-04-28 DIAGNOSIS — Z8659 Personal history of other mental and behavioral disorders: Secondary | ICD-10-CM | POA: Insufficient documentation

## 2014-04-28 MED ORDER — NAPROXEN 500 MG PO TABS: 500.0000 mg | ORAL_TABLET | Freq: Two times a day (BID) | ORAL | Status: DC

## 2014-04-28 MED ORDER — KETOROLAC TROMETHAMINE 60 MG/2ML IM SOLN
60.0000 mg | Freq: Once | INTRAMUSCULAR | Status: AC
  Administered 2014-04-28: 60 mg via INTRAMUSCULAR
  Filled 2014-04-28: qty 2

## 2014-04-28 MED ORDER — DIAZEPAM 5 MG PO TABS
5.0000 mg | ORAL_TABLET | Freq: Once | ORAL | Status: AC
  Administered 2014-04-28: 5 mg via ORAL
  Filled 2014-04-28: qty 1

## 2014-04-28 NOTE — ED Provider Notes (Signed)
CSN: [id number]     Arrival date & time 04/28/14  1544 History   First MD Initiated Contact with Patient 04/28/14 1633     This chart was scribed for non-physician practitioner, Joycie PeekBenjamin Cameren Odwyer PA-C working with Toy CookeyMegan Docherty, MD by Arlan OrganAshley Leger, ED Scribe. This patient was seen in room TR05C/TR05C and the patient's care was started at 4:51 PM.   Chief Complaint  Patient presents with  . Neck Pain  . Pain   The history is provided by the patient and a parent. No language interpreter was used.    HPI Comments: Joshua Lin is a 29 y.o. male with a PMHx of tourette disease who presents to the Emergency Department complaining of ongoing, intermittent, R sided neck and shoulder pain that has been chronic for 1-2 years now. Pain described as shooting. No recent injury or trauma to arm. Pt states pain has recent worsened 2-3 days ago. Mr. Chase CallerVarga reports shooting pain through shoulder and down to back of arm with certain movement of R arm. He has tried heat application to area with only mild temporary  improvement for discomfort. No recent fever, chills, nausea, vomiting, HA, or abdominal pain. Pt has been seen and treated previously but is uninsured and unable to follow up. Pt with known allergy to Sulfa antibiotic.  Past Medical History  Diagnosis Date  . Tourette disease    History reviewed. No pertinent past surgical history. History reviewed. No pertinent family history. History  Substance Use Topics  . Smoking status: Current Every Day Smoker  . Smokeless tobacco: Not on file  . Alcohol Use: Yes    Review of Systems  Constitutional: Negative for fever and chills.  Gastrointestinal: Negative for nausea, vomiting and abdominal pain.  Musculoskeletal: Positive for arthralgias.  Neurological: Negative for headaches.  All other systems reviewed and are negative.     Allergies  Sulfa antibiotics  Home Medications   Prior to Admission medications   Medication Sig Start Date End  Date Taking? Authorizing Provider  acetaminophen (TYLENOL) 500 MG tablet Take 1,000 mg by mouth every 6 (six) hours as needed for mild pain.    Historical Provider, MD  HYDROcodone-acetaminophen (NORCO/VICODIN) 5-325 MG per tablet Take 1 tablet by mouth every 6 (six) hours as needed. 02/16/14   Mellody DrownLauren Parker, PA-C  naproxen (NAPROSYN) 500 MG tablet Take 1 tablet (500 mg total) by mouth 2 (two) times daily. 04/28/14   Sharlene MottsBenjamin W Mccrae Speciale, PA-C    Triage Vitals: BP 127/88 mmHg  Pulse 68  Temp(Src) 97.4 F (36.3 C) (Oral)  Resp 22  SpO2 100%  Physical Exam  Constitutional: He appears well-developed and well-nourished. No distress.  HENT:  Head: Normocephalic and atraumatic.  Eyes: Conjunctivae and EOM are normal. Right eye exhibits no discharge. Left eye exhibits no discharge. No scleral icterus.  Neck: Normal range of motion. Neck supple. No tracheal deviation present.  Cardiovascular: Normal rate, regular rhythm, S1 normal, S2 normal, normal heart sounds and intact distal pulses.   Pulmonary/Chest: Effort normal and breath sounds normal.  Abdominal: Soft. He exhibits no distension. There is no tenderness.  Musculoskeletal: Normal range of motion. He exhibits no edema.  Diffuse tenderness over right trapezius and scapula. No overt midline bony tenderness. No other obvious lesions or deformities. Patient maintains full active range of motion, with some discomfort.  Neurological: He is alert.  Cranial nerves II-12 grossly intact. Motor and sensation 5/5 in all extremities . Gait appears baseline with no appreciable ataxia.  Skin: He  is not diaphoretic.  Nursing note and vitals reviewed.   ED Course  Procedures (including critical care time)  DIAGNOSTIC STUDIES: Oxygen Saturation is 100% on RA, Normal by my interpretation.    COORDINATION OF CARE: 4:59 PM-Discussed treatment plan with pt at bedside and pt agreed to plan.  a   Labs Review Labs Reviewed - No data to display  Imaging  Review No results found.   EKG Interpretation None     Meds given in ED:  Medications  ketorolac (TORADOL) injection 60 mg (60 mg Intramuscular Given 04/28/14 1734)  diazepam (VALIUM) tablet 5 mg (5 mg Oral Given 04/28/14 1735)    Discharge Medication List as of 04/28/2014  6:37 PM     Filed Vitals:   04/28/14 1549 04/28/14 1744 04/28/14 1852  BP: 127/88 116/86 106/73  Pulse: 68 60 66  Temp: 97.4 F (36.3 C)  97.9 F (36.6 C)  TempSrc: Oral  Oral  Resp: SpO2: 100% 100% 98%    MDM  Vitals stable - WNL -afebrile Pt resting comfortably in ED. pain is much improved in the ED with administered analgesia. PE--not concerning further acute or emergent pathology. Normal neuro exam. Gait is baseline without any ataxia.  DDX--patient likely experiencing a radicular pain and an associated musculoskeletal strain. This appears to be an acute exacerbation of chronic pain that he has been evaluated for in the past but has not received definitive care due to lack of insurance and poor follow-up with primary care. Will treat conservatively with NSAIDs. Resource guide given some patient may follow-up with [place] and wellness and establish primary care. No evidence of shoulder dislocation, hemarthrosis, septic joint, other acute vascular pathology.  Discharge with naproxen and resource guide in order to follow-up with [place] and wellness to establish care. I discussed all relevant lab findings and imaging results with pt and they verbalized understanding. Discussed f/u with PCP within 48 hrs and return precautions, pt very amenable to plan.  Final diagnoses:  Neck pain  Right shoulder pain      I personally performed the services described in this documentation, which was scribed in my presence. The recorded information has been reviewed and is accurate.    Earle Gell [place], PA-C 04/28/14 1943  Toy Cookey, MD 04/29/14 1153

## 2014-04-28 NOTE — ED Notes (Signed)
Pt sweating, pale, feels like going to pass out.  Head laid back in chair.  Feeling better after few minutes.

## 2014-04-28 NOTE — Discharge Instructions (Signed)
Arthralgia °Your caregiver has diagnosed you as suffering from an arthralgia. Arthralgia means there is pain in a joint. This can come from many reasons including: °· Bruising the joint which causes soreness (inflammation) in the joint. °· Wear and tear on the joints which occur as we grow older (osteoarthritis). °· Overusing the joint. °· Various forms of arthritis. °· Infections of the joint. °Regardless of the cause of pain in your joint, most of these different pains respond to anti-inflammatory drugs and rest. The exception to this is when a joint is infected, and these cases are treated with antibiotics, if it is a bacterial infection. °HOME CARE INSTRUCTIONS  °· Rest the injured area for as long as directed by your caregiver. Then slowly start using the joint as directed by your caregiver and as the pain allows. Crutches as directed may be useful if the ankles, knees or hips are involved. If the knee was splinted or casted, continue use and care as directed. If an stretchy or elastic wrapping bandage has been applied today, it should be removed and re-applied every 3 to 4 hours. It should not be applied tightly, but firmly enough to keep swelling down. Watch toes and feet for swelling, bluish discoloration, coldness, numbness or excessive pain. If any of these problems (symptoms) occur, remove the ace bandage and re-apply more loosely. If these symptoms persist, contact your caregiver or return to this location. °· For the first 24 hours, keep the injured extremity elevated on pillows while lying down. °· Apply ice for 15-20 minutes to the sore joint every couple hours while awake for the first half day. Then 03-04 times per day for the first 48 hours. Put the ice in a plastic bag and place a towel between the bag of ice and your skin. °· Wear any splinting, casting, elastic bandage applications, or slings as instructed. °· Only take over-the-counter or prescription medicines for pain, discomfort, or fever as  directed by your caregiver. Do not use aspirin immediately after the injury unless instructed by your physician. Aspirin can cause increased bleeding and bruising of the tissues. °· If you were given crutches, continue to use them as instructed and do not resume weight bearing on the sore joint until instructed. °Persistent pain and inability to use the sore joint as directed for more than 2 to 3 days are warning signs indicating that you should see a caregiver for a follow-up visit as soon as possible. Initially, a hairline fracture (break in bone) may not be evident on X-rays. Persistent pain and swelling indicate that further evaluation, non-weight bearing or use of the joint (use of crutches or slings as instructed), or further X-rays are indicated. X-rays may sometimes not show a small fracture until a week or 10 days later. Make a follow-up appointment with your own caregiver or one to whom we have referred you. A radiologist (specialist in reading X-rays) may read your X-rays. Make sure you know how you are to obtain your X-ray results. Do not assume everything is normal if you do not hear from us. °SEEK MEDICAL CARE IF: °Bruising, swelling, or pain increases. °SEEK IMMEDIATE MEDICAL CARE IF:  °· Your fingers or toes are numb or blue. °· The pain is not responding to medications and continues to stay the same or get worse. °· The pain in your joint becomes severe. °· You develop a fever over 102° F (38.9° C). °· It becomes impossible to move or use the joint. °MAKE SURE YOU:  °·   Understand these instructions.  Will watch your condition.  Will get help right away if you are not doing well or get worse. Document Released: 04/03/2005 Document Revised: 06/26/2011 Document Reviewed: 11/20/2007 [facility] Patient Information 2015 [place], [place]. This information is not intended to replace advice given to you by your health care provider. Make sure you discuss any questions you have with your health care  provider.   Emergency Department Resource Guide 1) Find a Doctor and Pay Out of Pocket Although you won't have to find out who is covered by your insurance plan, it is a good idea to ask around and get recommendations. You will then need to call the office and see if the doctor you have chosen will accept you as a new patient and what types of options they offer for patients who are self-pay. Some doctors offer discounts or will set up payment plans for their patients who do not have insurance, but you will need to ask so you aren't surprised when you get to your appointment.  2) Contact Your Local Health Department Not all health departments have doctors that can see patients for sick visits, but many do, so it is worth a call to see if yours does. If you don't know where your local health department is, you can check in your phone book. The CDC also has a tool to help you locate your state's health department, and many state websites also have listings of all of their local health departments.  3) Find a Walk-in Clinic If your illness is not likely to be very severe or complicated, you may want to try a walk in clinic. These are popping up all over the country in pharmacies, drugstores, and shopping centers. They're usually staffed by nurse practitioners or physician assistants that have been trained to treat common illnesses and complaints. They're usually fairly quick and inexpensive. However, if you have serious medical issues or chronic medical problems, these are probably not your best option.  No Primary Care Doctor: - Call Health Connect at  [phone] - they can help you locate a primary care doctor that  accepts your insurance, provides certain services, etc. - Physician Referral Service- [phone]  Chronic Pain Problems: Organization         Address  Phone   Notes  [facility]  [phone] Patients need to be referred by their primary care doctor.    Medication Assistance: Organization         Address  Phone   Notes  [facility] Medication [facility] [address] [place], [place] [address] [phone] --Must be a resident of [facility] -- Must have NO insurance coverage whatsoever (no Medicaid/ Medicare, etc.) -- The pt. MUST have a primary care doctor that directs their care regularly and follows them in the community   [facility]  [phone]   [employer]  [phone]    Agencies that provide inexpensive medical care: Organization         Address  Phone   Notes  [facility]  [phone]   [facility]    [phone]   [facility] [address] [place], [place] [address] [phone]   [facility] [address], [place] [phone]   Planned Parenthood    [phone]   [facility]    [phone]   [facility]  [address], [place] Phone:  [phone], Fax:  [phone] Hours of Operation:  9 am - 6 pm, M-F.  Also accepts Medicaid/Medicare and self-pay.  °[facility] ° [address], [place] Phone: [phone]) [phone], Fax: [phone]. Hours of Operation:  8:30 am - 5:30 pm, M-F.  Also accepts Medicaid and self-pay.  °[facility] [place] [address], [place] Phone: ([phone]) [phone]   °[facility] [address], [place], [place] (336)[phone], Ext. 123 Mondays & Thursdays: 7-9 AM.  First 15 patients are seen on a first come, first serve basis. °  ° °Medicaid-accepting [place] Providers: ° °Organization         Address  Phone   Notes  °[facility] [address], [place] ([phone]) [phone] Also accepts self-pay patients.  °[facility] [address], [place] ° (336) [phone]   °[facility] [address], [place] (336) [phone]   °[facility] [address] [place] [address], [place] (336) [phone]   °Veita Bland [address], [place]  ° ([phone]) [phone] Only accepts [place] Access Medicaid patients after they have their name applied to their card.  ° °Self-Pay (no insurance) in [place]: ° °Organization         Address  Phone   Notes  °Sickle Cell Patients, [facility] [address], [place] (336) [phone]   °[facility] [address], [place] (336) [phone]   °[facility] ° [address], [place] (336) [phone]   °[facility]/Dr. Osei-Bonsu ° [address] [place] [address], [place] or [address], [place] ([phone]) [phone] Phone number for both [place] and [place] locations is the same.  °[facility] [address], [place] ([phone]) [phone]   °[facility] [address] [place] [address], [place] or [address] (336) 852-7530 °(336) [phone]   °[facility] [address], [place] (336) [phone], phone; (336) [phone], fax Sees patients 1st and 3rd Saturday of every month.  Must not qualify for public or private insurance (i.e. Medicaid, Medicare, [place] Health Choice, Veterans' Benefits) • Household income should be no more than 200% of the poverty level •The clinic cannot treat you if you are pregnant or think you are pregnant • Sexually transmitted diseases are not treated at the clinic.  ° ° °Dental Care: °Organization         Address  Phone  Notes  °[place] [facility] [address], [place] (336) [phone] Accepts children up to age 21 who are enrolled in Medicaid or [place] Health Choice; pregnant women with a Medicaid card; and children who have applied for Medicaid or [place] Health Choice, but were declined, whose parents can pay a reduced fee at time of service.    °[place] [facility] [place]  [address], [place] (336) 641-7733 Accepts children up to age 21 who are enrolled in Medicaid or [place] Health Choice; pregnant women with a Medicaid card; and children who have applied for Medicaid or [place] Health Choice, but were declined, whose parents can pay a reduced fee at time of service.  °[facility] ° [address], [place] (336)   [phone] Patients are seen by appointment only. Walk-ins are not accepted. [facility] will see patients 18 years of age and older. °Monday - Tuesday (8am-5pm) °Most Wednesdays (8:30-5pm) °$30 per visit, cash only  °[facility] ° [address], [place] (336) [phone] Patients are seen by appointment only. Walk-ins are not accepted. [facility] will see patients 18 years of age and older. °One Wednesday Evening (Monthly: Volunteer Based).  $30 per visit, cash only  °[facility]  ([phone]) [phone] for adults; Children under age 4, call Graduate Pediatric Dentistry at ([phone]. Children aged 4-14, please call [phone]) [phone] to request a pediatric application. ° Dental services are provided in all areas of dental care including fillings, crowns and bridges, complete and partial dentures, implants, gum treatment, root canals, and extractions. Preventive care is also provided. Treatment is provided to both adults and children. °Patients are selected via a lottery and there is often a waiting list. °  °[facility] [address], °[place] ° (336) 763-8833 [email or web address] °  °[facility] [address], [place], [place] (336)723-1848, Ext. 123 Second and Fourth Thursday of each month, opens at 6:30 AM; Clinic ends at 9 AM.  Patients are seen on a first-come first-served basis, and a limited number are seen during each clinic.  ° °[facility] ° [address], [place], [place] (336)  [phone]   Eligibility Requirements °You must have lived in [place], [place], or [place] counties for at least the last three months. °  You cannot be eligible for state or federal sponsored healthcare insurance, including Veterans Administration, Medicaid, or Medicare. °  You generally cannot be eligible for healthcare insurance through your employer.  °  How to apply: °Eligibility screenings are held every Tuesday and Wednesday afternoon from 1:00 pm until 4:00 pm. You do not need an appointment for the interview!  °[facility] [address], [place], [place] 336-631-2330   °[place] [facility]  336-342-8273   °[place] [facility]  336-703-3100   ° [facility]  336-570-6415   ° °Behavioral Health Resources in the Community: °Intensive Outpatient Programs °Organization         Address  Phone  Notes  °[facility] [address], [place], [place] 336-878-6098   °[facility] [address], [place], [place] 336-832-9800   °[facility] [address], [place], [place] ° 336-882-2125   °[place] [address],  °[place], [place] 1-800-853-5163 or 336-641-4981   °Substance Abuse Resources °Organization         Address  Phone  Notes  °[facility]  336-882-2125   °[facility]  336-784-9470   °[facility]  336-285-9073   °[facility]  336-845-3988   °Residential & Outpatient Substance Abuse Program  1-800-659-3381   °Psychological Services °Organization         Address  Phone  Notes  °[facility]  336- 832-9600   °[facility]  336- 378-7881   °[facility] [address], [place] 1-800-853-5163 or 336-641-4981   ° °Mobile Crisis Teams °Organization         Address  Phone  Notes  °[facility], Mobile Crisis Care Unit  1-877-626-1772   °Assertive °[facility] ° [address], [place] 336-834-9664   °Sharon DeEsch [address]   [address] [place] [phone])[phone]    Self-Help/Support Groups Organization         Address  Phone             Notes  [facility] [place] - variety of support groups  336- [phone] Call for more information  Narcotics Anonymous (NA), [facility] [address], [place]  2 meetings at this location   [facility] Treatment Programs Organization         Address  Phone  Notes  [facility] [address],    [place]  [phone]   [facility]  [address], [place] [phone], [place], [place] [phone]   [facility] [address], [place] [phone] Admissions: 8am-3pm M-F  [facility] [address]    [place], [place] [phone]   [facility] [address], [place], [place] [phone]   [facility] [address]  [place], [place] [phone]   [facility] Intensive Outpatient [address], [place], [place] [phone]) [phone]   [facility] ([facility] [address]  [place], [place] [phone] or [phone]   [facility] ([facility]) [address] [place], [place] [phone] Accepts Medicaid  [facility] [address]  [place] [phone] Substance Abuse/Addiction Treatment   [facility] Resources Organization         Address  Phone  Notes  [facility]  [phone](888) ([phone]   Angie FavaJulie Brannon, PhD [address], [address] [place], [place]   [phone](336) [phone] or [phone](336) [phone]   [facility]   [address] [place], [place] [phone](336) [phone]   [facility] [address], [place], [place] [phone](336) [phone] Insurance/Medicaid/sponsorship through [facility]  [facility] [address]                                    [place], [place] [phone](336) [phone]  Therapy/tele-psych/case  [facility] [address]   [place], [place] [phone](336) [phone]    Dr. Lolly MustacheArfeen  [phone](336) [phone]   [facility]  [facility] 1) [address], [place] 2) [address], [place] 3)  [address], [place] [phone](336) [phone](336) [phone]  [phone](336) [phone]   [facility] Child Abuse Hotline (507)705-7253(336) 650-566-9585 or 313-632-7353(336) ([phone] (After Hours)         It is important for you to take your medications as prescribed. Please follow-up with primary care for further evaluation and management of your symptoms. Return to ED for worsening symptoms.

## 2014-04-28 NOTE — ED Notes (Signed)
Presents with one-two years of right sided neck and shoulder pain that comes and goes, he has been seen and treated here a few times in past, uninsured so unable to follow up. This episode began two-three days ago and happens after moving wrong or a hard sneeze.

## 2014-10-04 IMAGING — CR DG ABDOMEN 1V
1 series · 1 of 1 positions shown · non-contrast
Comparison: None.

CLINICAL DATA: Abdominal pain 6 days

EXAM:
ABDOMEN - 1 VIEW

[t abdomen supine]
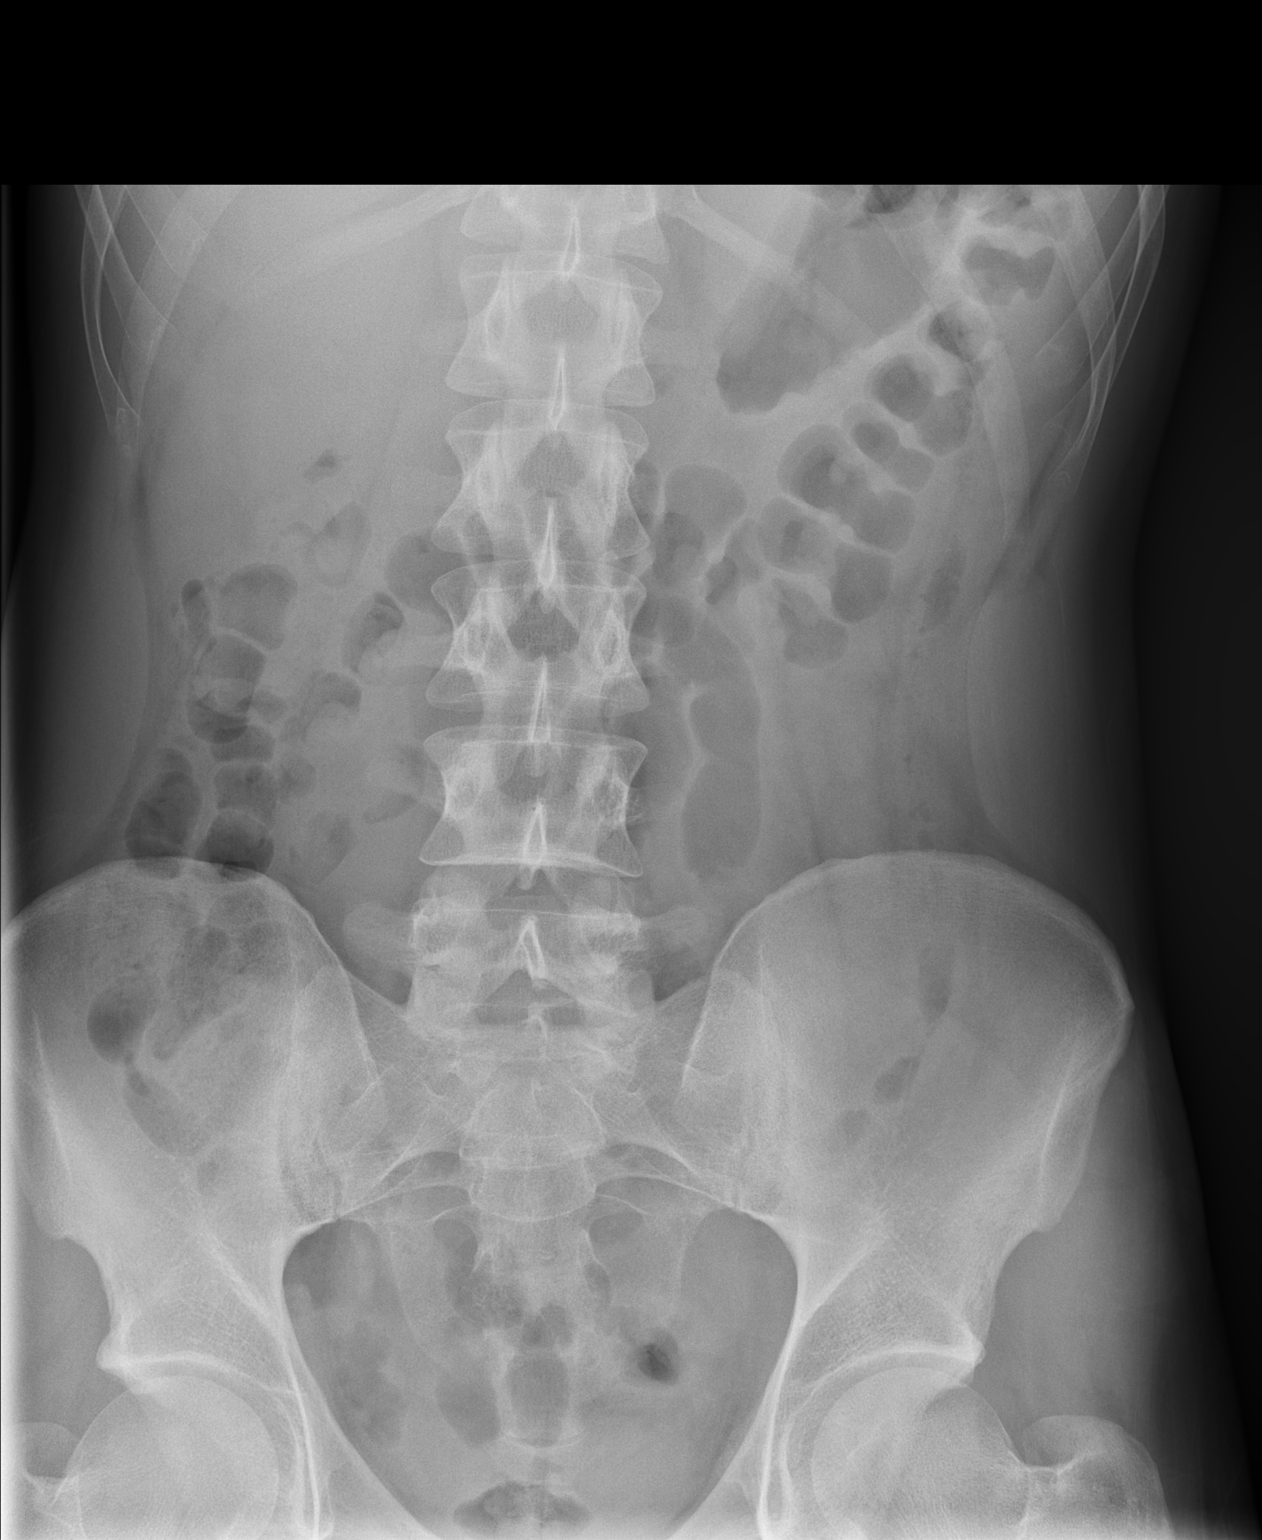

[1 of 1 positions shown; findings below may reference images not displayed]

FINDINGS: No dilated loops of large or small bowel. There is gas in the
rectum. No pathologic calcifications. No organomegaly.
IMPRESSION: No abdominal abnormality by radiograph.

## 2015-07-14 ENCOUNTER — Emergency Department (HOSPITAL_COMMUNITY)
Admission: EM | Admit: 2015-07-14 | Discharge: 2015-07-14 | Disposition: A | Discharge status: Home / Self Care | Payer: No Typology Code available for payment source | Attending: Emergency Medicine | Admitting: Emergency Medicine

## 2015-07-14 ENCOUNTER — Encounter (HOSPITAL_COMMUNITY): Payer: Self-pay | Admitting: *Deleted

## 2015-07-14 DIAGNOSIS — M545 Low back pain, unspecified: Secondary | ICD-10-CM

## 2015-07-14 DIAGNOSIS — R109 Unspecified abdominal pain: Secondary | ICD-10-CM | POA: Insufficient documentation

## 2015-07-14 DIAGNOSIS — R260 Ataxic gait: Secondary | ICD-10-CM | POA: Insufficient documentation

## 2015-07-14 DIAGNOSIS — F17200 Nicotine dependence, unspecified, uncomplicated: Secondary | ICD-10-CM | POA: Insufficient documentation

## 2015-07-14 MED ORDER — NAPROXEN 500 MG PO TABS: 500.0000 mg | ORAL_TABLET | Freq: Two times a day (BID) | ORAL | Status: DC

## 2015-07-14 MED ORDER — METHOCARBAMOL 500 MG PO TABS: 500.0000 mg | ORAL_TABLET | Freq: Two times a day (BID) | ORAL | Status: DC

## 2015-07-14 NOTE — ED Provider Notes (Signed)
CSN: [id number]     Arrival date & time 07/14/15  1525 History   By signing my name below, I, Freida BusmanDiana Omoyeni, attest that this documentation has been prepared under the direction and in the presence of non-physician practitioner, Fayrene HelperBowie Yuniel Blaney, PA-C. Electronically Signed: Freida Busmaniana Omoyeni, Scribe. 07/14/2015. 3:56 PM.    Chief Complaint  Patient presents with  . Flank Pain  . Back Pain     The history is provided by the patient. No language interpreter was used.     HPI Comments:  Joshua Lin is a 30 y.o. male who presents to the Emergency Department complaining of sharp back pain which he woke up with 3 days ago. The pain is constant but waxes and wanes in severity. He denies recent injury and heavy lifting. He has been taking ibuprofen, ~2-3 pills every six hours,  and vicodin with mild relief. He also notes improvement of pain when laying down. Pt denies dysuria, hematuria, bowel/bladder incontinence, fever, recent nausea, vomiting, abdominal pain, and penile pain. No alleviating factors noted.   Past Medical History  Diagnosis Date  . Tourette disease    History reviewed. No pertinent past surgical history. No family history on file. Social History  Substance Use Topics  . Smoking status: Current Every Day Smoker  . Smokeless tobacco: None  . Alcohol Use: Yes    Review of Systems  Constitutional: Negative for fever.  Gastrointestinal: Negative for nausea, vomiting, abdominal pain and diarrhea.  Genitourinary: Positive for flank pain. Negative for dysuria, hematuria, difficulty urinating and penile pain.  Musculoskeletal: Positive for back pain.    Allergies  Sulfa antibiotics  Home Medications   Prior to Admission medications   Medication Sig Start Date End Date Taking? Authorizing Provider  acetaminophen (TYLENOL) 500 MG tablet Take 1,000 mg by mouth every 6 (six) hours as needed for mild pain.    Historical Provider, MD  HYDROcodone-acetaminophen (NORCO/VICODIN) 5-325  MG per tablet Take 1 tablet by mouth every 6 (six) hours as needed. 02/16/14   Mellody DrownLauren Parker, PA-C  methocarbamol (ROBAXIN) 500 MG tablet Take 1 tablet (500 mg total) by mouth 2 (two) times daily. 07/14/15   Fayrene HelperBowie Breeana Sawtelle, PA-C  naproxen (NAPROSYN) 500 MG tablet Take 1 tablet (500 mg total) by mouth 2 (two) times daily. 07/14/15   Fayrene HelperBowie Connie Lasater, PA-C   BP 132/82 mmHg  Pulse 93  Temp(Src) 98.2 F (36.8 C) (Oral)  Resp 16  Ht 6\' 2"  (1.88 m)  Wt 155 lb (70.308 kg)  BMI 19.89 kg/m2  SpO2 98% Physical Exam  Constitutional: He is oriented to person, place, and time. He appears well-developed and well-nourished. No distress.  HENT:  Head: Normocephalic and atraumatic.  Eyes: Conjunctivae are normal.  Cardiovascular: Normal rate.   Pulmonary/Chest: Effort normal.  Abdominal: Soft. He exhibits no distension. There is no tenderness. There is no CVA tenderness.  Musculoskeletal:  Left paraspinal tenderness on percussion  No overlying skin changes  No midline skin changes, crepitus or step off  Mild antalgic gait   Neurological: He is alert and oriented to person, place, and time. He has normal reflexes.  Skin: Skin is warm and dry. No erythema.  Psychiatric: He has a normal mood and affect.  Nursing note and vitals reviewed.   ED Course  Procedures   DIAGNOSTIC STUDIES:  Oxygen Saturation is 98% on RA, normal by my interpretation.    COORDINATION OF CARE:  3:54 PM Discussed treatment plan with pt at bedside and pt agreed to plan.  MDM  Patient with back pain.  No neurological deficits and normal neuro exam.  Patient is ambulatory.  No loss of bowel or bladder control.  No concern for cauda equina.  No fever, night sweats, weight loss, h/o cancer, IVDA, no recent procedure to back. No urinary symptoms suggestive of UTI.  no blood in urine to suggest kidney stone.  I did offer to check UA vs Supportive care and return precaution discussed. Pt prefers sxs treatment.  He understand to return if  sxs worsen.  No infectious etiology noted.  Appears safe for discharge at this time. Follow up as indicated in discharge paperwork.   Final diagnoses:  Left-sided low back pain without sciatica    BP 132/82 mmHg  Pulse 93  Temp(Src) 98.2 F (36.8 C) (Oral)  Resp 16  Ht  (1.88 m)  Wt 70.308 kg  BMI 19.89 kg/m2  SpO2 98%   I personally performed the services described in this documentation, which was scribed in my presence. The recorded information has been reviewed and is accurate.      Fayrene Helper, PA-C 07/14/15 1600  Pricilla Loveless, MD 07/20/15 1125

## 2015-07-14 NOTE — Discharge Instructions (Signed)
Return to the ER if you develop fever, abdominal pain, blood in your urine, numbness down your leg or if you have other concerns.   Back Pain, Adult Back pain is very common in adults.The cause of back pain is rarely dangerous and the pain often gets better over time.The cause of your back pain may not be known. Some common causes of back pain include:  Strain of the muscles or ligaments supporting the spine.  Wear and tear (degeneration) of the spinal disks.  Arthritis.  Direct injury to the back. For many people, back pain may return. Since back pain is rarely dangerous, most people can learn to manage this condition on their own. HOME CARE INSTRUCTIONS Watch your back pain for any changes. The following actions may help to lessen any discomfort you are feeling:  Remain active. It is stressful on your back to sit or stand in one place for long periods of time. Do not sit, drive, or stand in one place for more than 30 minutes at a time. Take short walks on even surfaces as soon as you are able.Try to increase the length of time you walk each day.  Exercise regularly as directed by your health care provider. Exercise helps your back heal faster. It also helps avoid future injury by keeping your muscles strong and flexible.  Do not stay in bed.Resting more than 1-2 days can delay your recovery.  Pay attention to your body when you bend and lift. The most comfortable positions are those that put less stress on your recovering back. Always use proper lifting techniques, including:  Bending your knees.  Keeping the load close to your body.  Avoiding twisting.  Find a comfortable position to sleep. Use a firm mattress and lie on your side with your knees slightly bent. If you lie on your back, put a pillow under your knees.  Avoid feeling anxious or stressed.Stress increases muscle tension and can worsen back pain.It is important to recognize when you are anxious or stressed and  learn ways to manage it, such as with exercise.  Take medicines only as directed by your health care provider. Over-the-counter medicines to reduce pain and inflammation are often the most helpful.Your health care provider may prescribe muscle relaxant drugs.These medicines help dull your pain so you can more quickly return to your normal activities and healthy exercise.  Apply ice to the injured area:  Put ice in a plastic bag.  Place a towel between your skin and the bag.  Leave the ice on for 20 minutes, 2-3 times a day for the first 2-3 days. After that, ice and heat may be alternated to reduce pain and spasms.  Maintain a healthy weight. Excess weight puts extra stress on your back and makes it difficult to maintain good posture. SEEK MEDICAL CARE IF:  You have pain that is not relieved with rest or medicine.  You have increasing pain going down into the legs or buttocks.  You have pain that does not improve in one week.  You have night pain.  You lose weight.  You have a fever or chills. SEEK IMMEDIATE MEDICAL CARE IF:   You develop new bowel or bladder control problems.  You have unusual weakness or numbness in your arms or legs.  You develop nausea or vomiting.  You develop abdominal pain.  You feel faint.   This information is not intended to replace advice given to you by your health care provider. Make sure you discuss  any questions you have with your health care provider.   Document Released: 04/03/2005 Document Revised: 04/24/2014 Document Reviewed: 08/05/2013 Elsevier Interactive Patient Education [employer].

## 2015-07-14 NOTE — ED Notes (Signed)
Pt here for Left lower back pain that increases with movement.  Not associated with any trauma.  No GU symptoms with this

## 2016-08-01 ENCOUNTER — Emergency Department (HOSPITAL_COMMUNITY)
Admission: EM | Admit: 2016-08-01 | Discharge: 2016-08-01 | Disposition: A | Discharge status: Home / Self Care | Payer: No Typology Code available for payment source | Attending: Emergency Medicine | Admitting: Emergency Medicine

## 2016-08-01 ENCOUNTER — Encounter (HOSPITAL_COMMUNITY): Payer: Self-pay | Admitting: Emergency Medicine

## 2016-08-01 DIAGNOSIS — F17200 Nicotine dependence, unspecified, uncomplicated: Secondary | ICD-10-CM | POA: Insufficient documentation

## 2016-08-01 DIAGNOSIS — K047 Periapical abscess without sinus: Secondary | ICD-10-CM | POA: Insufficient documentation

## 2016-08-01 MED ORDER — CLINDAMYCIN HCL 150 MG PO CAPS
300.0000 mg | ORAL_CAPSULE | Freq: Once | ORAL | Status: AC
  Administered 2016-08-01: 300 mg via ORAL
  Filled 2016-08-01: qty 2

## 2016-08-01 MED ORDER — NAPROXEN 500 MG PO TABS: 500.0000 mg | ORAL_TABLET | Freq: Two times a day (BID) | ORAL | 0 refills | Status: DC

## 2016-08-01 MED ORDER — CLINDAMYCIN HCL 300 MG PO CAPS: 300.0000 mg | ORAL_CAPSULE | Freq: Three times a day (TID) | ORAL | 0 refills | Status: DC

## 2016-08-01 NOTE — ED Triage Notes (Signed)
Pt sts right upper dental pain with swelling x 3 days  

## 2016-08-01 NOTE — ED Provider Notes (Signed)
[facility] Provider Note   CSN: [id number] Arrival date & time: 08/01/16  1543  By signing my name below, I, Linna Darner, attest that this documentation has been prepared under the direction and in the presence of non-physician practitioner, [facility] M. Damian Leavell, NP. Electronically Signed: Linna Darner, Scribe. 08/01/2016. 4:07 PM.  History   Chief Complaint Chief Complaint  Patient presents with  . Dental Pain    The history is provided by the patient. No language interpreter was used.  Dental Pain   This is a new problem. The current episode started yesterday. The problem occurs constantly. The problem has not changed since onset.The pain is at a severity of 8/10. He has tried nothing for the symptoms.    HPI Comments: Joshua Lin is a 31 y.o. male who presents to the Emergency Department complaining of constant, 8/10, right upper dental pain beginning yesterday. He reports associated right-sided facial swelling as well as nausea secondary to pain. No alleviating factors noted. Pt endorses pain exacerbation with applied pressure to his right upper teeth. He denies sore throat, headache, ear pain, abdominal pain, vomiting, fevers, trismus, trouble swallowing, or any other associated symptoms.  Past Medical History:  Diagnosis Date  . Tourette disease     There are no active problems to display for this patient.   History reviewed. No pertinent surgical history.     Home Medications    Prior to Admission medications   Medication Sig Start Date End Date Taking? Authorizing Provider  acetaminophen (TYLENOL) 500 MG tablet Take 1,000 mg by mouth every 6 (six) hours as needed for mild pain.    Historical Provider, MD  clindamycin (CLEOCIN) 300 MG capsule Take 1 capsule (300 mg total) by mouth 3 (three) times daily. 08/01/16   Hope Orlene Och, NP  HYDROcodone-acetaminophen (NORCO/VICODIN) 5-325 MG per tablet Take 1 tablet by mouth every 6 (six) hours as needed. 02/16/14    Mellody Drown, PA-C  methocarbamol (ROBAXIN) 500 MG tablet Take 1 tablet (500 mg total) by mouth 2 (two) times daily. 07/14/15   Fayrene Helper, PA-C  naproxen (NAPROSYN) 500 MG tablet Take 1 tablet (500 mg total) by mouth 2 (two) times daily. 08/01/16   Hope Orlene Och, NP    Family History History reviewed. No pertinent family history.  Social History Social History  Substance Use Topics  . Smoking status: Current Every Day Smoker  . Smokeless tobacco: Not on file  . Alcohol use Yes     Allergies   Sulfa antibiotics   Review of Systems Review of Systems  Constitutional: Negative for fever.  HENT: Positive for dental problem and facial swelling. Negative for ear pain, sore throat and trouble swallowing.   Gastrointestinal: Positive for nausea. Negative for abdominal pain and vomiting.  Musculoskeletal: Negative for neck stiffness.  Skin: Wound: abscess mouth.  Neurological: Negative for headaches.  All other systems reviewed and are negative.  Physical Exam Updated Vital Signs BP 110/85 (BP Location: Left Arm)   Pulse 93   Temp 99.3 F (37.4 C) (Oral)   Resp 16   Ht  (1.88 m)   Wt 72.8 kg   BMI 20.62 kg/m   Physical Exam  Constitutional: He is oriented to person, place, and time. He appears well-developed and well-nourished. No distress.  HENT:  Head: Normocephalic and atraumatic.  Mouth/Throat: No trismus in the jaw. Dental caries present.  Multiple dental caries. Decay goes to the gumline on the first and second right lower molars. Multiple dental  caries to the right upper dental area with swelling of the gums surrounding the teeth. Abscess noted to the gum above the central upper incisors. No difficulty swallowing.  Eyes: Conjunctivae and EOM are normal.  Neck: Neck supple. No tracheal deviation present.  Cardiovascular: Normal rate and regular rhythm.   Pulmonary/Chest: Effort normal and breath sounds normal.  Musculoskeletal: Normal range of motion.    Lymphadenopathy:    He has cervical adenopathy (anterior).  Neurological: He is alert and oriented to person, place, and time.  Skin: Skin is warm and dry.  Psychiatric: He has a normal mood and affect. His behavior is normal.  Nursing note and vitals reviewed.  ED Treatments / Results  Labs (all labs ordered are listed, but only abnormal results are displayed) Labs Reviewed - No data to display   Radiology No results found.  Procedures Procedures (including critical care time)  COORDINATION OF CARE: 4:11 PM Discussed treatment plan with pt at bedside and pt agreed to plan.  Medications Ordered in ED Medications  clindamycin (CLEOCIN) capsule 300 mg (not administered)     Initial Impression / Assessment and Plan / ED Course  I have reviewed the triage vital signs and the nursing notes. Final Clinical Impressions(s) / ED Diagnoses  Patient with dentalgia.  No abscess requiring immediate incision and drainage.  Exam not concerning for Ludwig's angina or pharyngeal abscess.  Will treat with Clindamycin and Naprosyn. Pt instructed to follow-up with dentist at [facility].  Discussed return precautions. Pt safe for discharge. Final diagnoses:  Dental abscess    New Prescriptions New Prescriptions   CLINDAMYCIN (CLEOCIN) 300 MG CAPSULE    Take 1 capsule (300 mg total) by mouth 3 (three) times daily.   NAPROXEN (NAPROSYN) 500 MG TABLET    Take 1 tablet (500 mg total) by mouth 2 (two) times daily.   I personally performed the services described in this documentation, which was scribed in my presence. The recorded information has been reviewed and is accurate.    [place], NP 08/01/16 1630    Maia Plan, MD 08/02/16 [phone]

## 2018-11-24 ENCOUNTER — Encounter (HOSPITAL_COMMUNITY): Payer: Self-pay

## 2018-11-24 ENCOUNTER — Ambulatory Visit (HOSPITAL_COMMUNITY)
Admission: EM | Admit: 2018-11-24 | Discharge: 2018-11-24 | Disposition: A | Discharge status: Home / Self Care | Payer: No Typology Code available for payment source | Attending: Family Medicine | Admitting: Family Medicine

## 2018-11-24 ENCOUNTER — Other Ambulatory Visit: Payer: Self-pay

## 2018-11-24 DIAGNOSIS — R21 Rash and other nonspecific skin eruption: Secondary | ICD-10-CM

## 2018-11-24 DIAGNOSIS — B029 Zoster without complications: Secondary | ICD-10-CM

## 2018-11-24 MED ORDER — IBUPROFEN 600 MG PO TABS: 600.0000 mg | ORAL_TABLET | Freq: Four times a day (QID) | ORAL | 0 refills | Status: DC | PRN

## 2018-11-24 MED ORDER — VALACYCLOVIR HCL 1 G PO TABS: 1000.0000 mg | ORAL_TABLET | Freq: Three times a day (TID) | ORAL | 0 refills | Status: AC

## 2018-11-24 NOTE — ED Triage Notes (Signed)
Patient presents to Urgent Care with complaints of rash on the back of his neck that extends into his hairline since 2 days ago. Patient reports he thinks it is shingles, rash is painful.

## 2018-11-24 NOTE — ED Provider Notes (Signed)
[facility]    CSN: [id number] Arrival date & time: 11/24/18  1722      History   Chief Complaint Chief Complaint  Patient presents with  . Rash    Shingles?    HPI Joshua Lin is a 33 y.o. male history of Tourette's presenting today for evaluation of a rash.  Patient states that he has developed a rash to his upper back and neck/scalp over the past 2 days.  Has mainly been on the right side.  Is concerned for shingles.  Had chickenpox when he was younger.  Rashes associated with sensitivity to clothing, itching and burning.  Denies previous history of shingles.  Denies rash elsewhere on body or on left side.  Denies fevers.  Denies sores in mouth.  Denies change in vision.  HPI  Past Medical History:  Diagnosis Date  . Tourette disease     There are no active problems to display for this patient.   History reviewed. No pertinent surgical history.     Home Medications    Prior to Admission medications   Medication Sig Start Date End Date Taking? Authorizing Provider  busPIRone (BUSPAR) 15 MG tablet Take 15 mg by mouth 3 (three) times daily. 08/19/18   [provider]  escitalopram (LEXAPRO) 10 MG tablet TAKE 1 TABLET BY MOUTH EVERY DAY IN THE MORNING 08/19/18   [provider]  ibuprofen (ADVIL) 600 MG tablet Take 1 tablet (600 mg total) by mouth every 6 (six) hours as needed. 11/24/18   ,  C, PA-C  valACYclovir (VALTREX) 1000 MG tablet Take 1 tablet (1,000 mg total) by mouth 3 (three) times daily for 14 days. 11/24/18 12/08/18  , Junius Creamer C, PA-C    Family History Family History  Problem Relation Age of Onset  . Asthma Mother   . Healthy Father     Social History Social History   Tobacco Use  . Smoking status: Current Every Day Smoker    Packs/day: 1.00  . Smokeless tobacco: Never Used  Substance Use Topics  . Alcohol use: Yes  . Drug use: Yes    Types: Marijuana     Allergies   Sulfa antibiotics   Review  of Systems Review of Systems  Constitutional: Negative for fatigue and fever.  HENT: Negative for mouth sores.   Eyes: Negative for redness, itching and visual disturbance.  Respiratory: Negative for shortness of breath.   Cardiovascular: Negative for chest pain and leg swelling.  Gastrointestinal: Negative for nausea and vomiting.  Musculoskeletal: Negative for arthralgias and myalgias.  Skin: Positive for color change and rash. Negative for wound.  Neurological: Negative for dizziness, syncope, weakness, light-headedness and headaches.     Physical Exam Triage Vital Signs ED Triage Vitals  Enc Vitals Group     BP 11/24/18 1734 119/70     Pulse Rate 11/24/18 1734 75     Resp 11/24/18 1734 17     Temp 11/24/18 1734 98.3 F (36.8 C)     Temp Source 11/24/18 1734 Oral     SpO2 11/24/18 1734 99 %     Weight --      Height --      Head Circumference --      Peak Flow --      Pain Score 11/24/18 1732 6     Pain Loc --      Pain Edu? --      Excl. in GC? --    No data found.  Updated Vital Signs BP 119/70 (BP Location: Right Arm)   Pulse 75   Temp 98.3 F (36.8 C) (Oral)   Resp 17   SpO2 99%   Visual Acuity Right Eye Distance:   Left Eye Distance:   Bilateral Distance:    Right Eye Near:   Left Eye Near:    Bilateral Near:     Physical Exam Vitals signs and nursing note reviewed.  Constitutional:      Appearance: He is well-developed.     Comments: No acute distress  HENT:     Head: Normocephalic and atraumatic.     Nose: Nose normal.  Eyes:     Conjunctiva/sclera: Conjunctivae normal.  Neck:     Musculoskeletal: Neck supple.  Cardiovascular:     Rate and Rhythm: Normal rate.  Pulmonary:     Effort: Pulmonary effort is normal. No respiratory distress.  Abdominal:     General: There is no distension.  Musculoskeletal: Normal range of motion.  Skin:    General: Skin is warm and dry.     Comments: Right upper thoracic back and cervical region extending  to scalp line on neck with erythematous papules.  Clustered area on neck appears more vesicular with crusting.  Does not cross midline.  No involvement of abdomen or lower extremities.  Has 1 lesion near preauricular area/mandibular curve.  No lesions noted near eyes.  Neurological:     Mental Status: He is alert and oriented to person, place, and time.      UC Treatments / Results  Labs (all labs ordered are listed, but only abnormal results are displayed) Labs Reviewed - No data to display  EKG   Radiology No results found.  Procedures Procedures (including critical care time)  Medications Ordered in UC Medications - No data to display  Initial Impression / Assessment and Plan / UC Course  I have reviewed the triage vital signs and the nursing notes.  Pertinent labs & imaging results that were available during my care of the patient were reviewed by me and considered in my medical decision making (see chart for details).     Given unilateral nature of rash, localized to cervical region and clustered vesicular appearance, will treat for shingles.  Initiating on Valtrex 3 times daily x2 weeks.  Tylenol and ibuprofen for pain.  Discussed typical course.  Advised to continue to monitor rash and follow-up if rash spreading to left side or elsewhere on body.Discussed strict return precautions. Patient verbalized understanding and is agreeable with plan.  Final Clinical Impressions(s) / UC Diagnoses   Final diagnoses:  Rash and nonspecific skin eruption  Herpes zoster without complication     Discharge Instructions     Begin valtrex three times daily for the next 14 days Use anti-inflammatories for pain/swelling. You may take up to 600 mg Ibuprofen every 8 hours with food. You may supplement Ibuprofen with Tylenol [phone] mg every 8 hours.  Please read attached information about shingles  Please follow-up if pain not controlled, rash spreading to left side or rash changing,  not resolving   ED Prescriptions    Medication Sig Dispense Auth. Provider   valACYclovir (VALTREX) 1000 MG tablet Take 1 tablet (1,000 mg total) by mouth 3 (three) times daily for 14 days. 42 tablet ,  C, PA-C   ibuprofen (ADVIL) 600 MG tablet Take 1 tablet (600 mg total) by mouth every 6 (six) hours as needed. 30 tablet , [place] C, PA-C  Controlled Substance Prescriptions [place] Controlled Substance Registry consulted? Not Applicable   Lew DawesWieters,  C, [place]PA-C 11/24/18 1812

## 2018-11-24 NOTE — Discharge Instructions (Signed)
Begin valtrex three times daily for the next 14 days Use anti-inflammatories for pain/swelling. You may take up to 600 mg Ibuprofen every 8 hours with food. You may supplement Ibuprofen with Tylenol [phone] mg every 8 hours.  Please read attached information about shingles  Please follow-up if pain not controlled, rash spreading to left side or rash changing, not resolving

## 2019-07-19 ENCOUNTER — Other Ambulatory Visit: Payer: Self-pay

## 2019-07-19 ENCOUNTER — Encounter (HOSPITAL_COMMUNITY): Payer: Self-pay

## 2019-07-19 ENCOUNTER — Ambulatory Visit (HOSPITAL_COMMUNITY)
Admission: EM | Admit: 2019-07-19 | Discharge: 2019-07-19 | Disposition: A | Discharge status: Home / Self Care | Payer: Self-pay | Attending: Family Medicine | Admitting: Family Medicine

## 2019-07-19 DIAGNOSIS — K0889 Other specified disorders of teeth and supporting structures: Secondary | ICD-10-CM

## 2019-07-19 DIAGNOSIS — S025XXB Fracture of tooth (traumatic), initial encounter for open fracture: Secondary | ICD-10-CM

## 2019-07-19 DIAGNOSIS — K029 Dental caries, unspecified: Secondary | ICD-10-CM

## 2019-07-19 DIAGNOSIS — K0510 Chronic gingivitis, plaque induced: Secondary | ICD-10-CM

## 2019-07-19 DIAGNOSIS — K089 Disorder of teeth and supporting structures, unspecified: Secondary | ICD-10-CM

## 2019-07-19 MED ORDER — NAPROXEN 500 MG PO TABS: 500.0000 mg | ORAL_TABLET | Freq: Two times a day (BID) | ORAL | 0 refills | Status: AC

## 2019-07-19 MED ORDER — CLINDAMYCIN HCL 150 MG PO CAPS: 150.0000 mg | ORAL_CAPSULE | Freq: Three times a day (TID) | ORAL | 0 refills | Status: AC

## 2019-07-19 NOTE — ED Provider Notes (Signed)
[facility]    CSN: [id number] Arrival date & time: 07/19/19  1713      History   Chief Complaint Chief Complaint  Patient presents with   Dental Pain    HPI Joshua Lin is a 34 y.o. male.   Patient complains of dental pain for the last 3 days.  Reports that it has been getting progressively worse over the last 3 days.  Patient reports he does not have an established dentist.  Also reports that this is not his first dental abscess.  Patient reports left-sided lower jaw pain, ear pain, left-sided facial pain with occasional headaches.  Per chart review, patient has medical history significant for Tourette's syndrome.  Patient not taking any medication to attempt to treat this at home.  Patient denies nausea, vomiting, diarrhea, chills, body aches, rash, fever, other symptoms.  ROS per HPI  The history is provided by the patient.    Past Medical History:  Diagnosis Date   Tourette disease     There are no problems to display for this patient.   History reviewed. No pertinent surgical history.     Home Medications    Prior to Admission medications   Medication Sig Start Date End Date Taking? Authorizing Provider  busPIRone (BUSPAR) 15 MG tablet Take 15 mg by mouth 3 (three) times daily. 08/19/18   [provider]  clindamycin (CLEOCIN) 150 MG capsule Take 1 capsule (150 mg total) by mouth 3 (three) times daily for 7 days. 07/19/19 07/26/19  Moshe Cipro, NP  escitalopram (LEXAPRO) 10 MG tablet TAKE 1 TABLET BY MOUTH EVERY DAY IN THE MORNING 08/19/18   [provider]  naproxen (NAPROSYN) 500 MG tablet Take 1 tablet (500 mg total) by mouth 2 (two) times daily. 07/19/19   Moshe Cipro, NP    Family History Family History  Problem Relation Age of Onset   Asthma Mother    Healthy Father     Social History Social History   Tobacco Use   Smoking status: Current Every Day Smoker    Packs/day: 1.00   Smokeless tobacco: Never  Used  Substance Use Topics   Alcohol use: Yes   Drug use: Yes    Types: Marijuana     Allergies   Sulfa antibiotics   Review of Systems Review of Systems   Physical Exam Triage Vital Signs ED Triage Vitals  Enc Vitals Group     BP 07/19/19 1727 (!) 151/101     Pulse Rate 07/19/19 1727 83     Resp 07/19/19 1727 18     Temp 07/19/19 1727 98.4 F (36.9 C)     Temp Source 07/19/19 1727 Oral     SpO2 07/19/19 1727 98 %     Weight --      Height --      Head Circumference --      Peak Flow --      Pain Score 07/19/19 1728 10     Pain Loc --      Pain Edu? --      Excl. in GC? --    No data found.  Updated Vital Signs BP (!) 151/101 (BP Location: Left Arm)    Pulse 83    Temp 98.4 F (36.9 C) (Oral)    Resp 18    SpO2 98%   Visual Acuity Right Eye Distance:   Left Eye Distance:   Bilateral Distance:    Right Eye Near:   Left Eye Near:  Bilateral Near:     Physical Exam Vitals and nursing note reviewed.  Constitutional:      General: He is not in acute distress.    Appearance: Normal appearance. He is well-developed and normal weight. He is ill-appearing.  HENT:     Head: Normocephalic and atraumatic.     Right Ear: Tympanic membrane normal.     Left Ear: Tympanic membrane normal.     Nose: Nose normal.     Mouth/Throat:     Lips: Pink.     Mouth: Mucous membranes are moist.     Dentition: Gingival swelling, dental caries and dental abscesses present.     Pharynx: Oropharynx is clear. No oropharyngeal exudate or posterior oropharyngeal erythema.      Comments: Area of tenderness, gingival swelling.  Almost all of the patient's teeth are gray/brown with multiple broken teeth and gingivitis. Eyes:     Extraocular Movements: Extraocular movements intact.     Conjunctiva/sclera: Conjunctivae normal.     Pupils: Pupils are equal, round, and reactive to light.  Cardiovascular:     Rate and Rhythm: Normal rate and regular rhythm.     Heart sounds: Normal  heart sounds. No murmur.  Pulmonary:     Effort: Pulmonary effort is normal. No respiratory distress.     Breath sounds: Normal breath sounds. No stridor. No wheezing or rhonchi.  Chest:     Chest wall: No tenderness.  Abdominal:     General: Bowel sounds are normal. There is no distension.     Palpations: Abdomen is soft. There is no mass.     Tenderness: There is no abdominal tenderness. There is no right CVA tenderness, left CVA tenderness, guarding or rebound.     Hernia: No hernia is present.  Musculoskeletal:        General: Normal range of motion.     Cervical back: Neck supple.  Lymphadenopathy:     Cervical: Cervical adenopathy (Left side) present.  Skin:    General: Skin is warm and dry.     Capillary Refill: Capillary refill takes less than 2 seconds.  Neurological:     General: No focal deficit present.     Mental Status: He is alert and oriented to person, place, and time.  Psychiatric:        Mood and Affect: Mood normal.        Behavior: Behavior normal.      UC Treatments / Results  Labs (all labs ordered are listed, but only abnormal results are displayed) Labs Reviewed - No data to display  EKG   Radiology No results found.  Procedures Procedures (including critical care time)  Medications Ordered in UC Medications - No data to display  Initial Impression / Assessment and Plan / UC Course  I have reviewed the triage vital signs and the nursing notes.  Pertinent labs & imaging results that were available during my care of the patient were reviewed by me and considered in my medical decision making (see chart for details).     Dental pain: Left lower jaw tenderness left-sided cervical adenopathy, left lower jaw gingivitis, multiple dental abscesses.  Upon inspection of the patient's mouth, almost all of the patient's teeth are gray-brown with gingivitis and swelling.  Multiple broken teeth in the patient's mouth.  Prescribed clindamycin 150 mg 3  times daily x7 days.  Also prescribed naproxen 500 mg twice daily as needed dental pain.  Instructed patient to deal with of new onset diarrhea,  denies clindamycin is the best treatment for dental infections, but can also cause C. difficile infections.  Instructed patient that he needs to follow-up with a dentist.  Discussed the seriousness of poor dental hygiene multiple dental caries, and abscesses.  Discussed that the infection could travel to his heart and cause an infected pericarditis, that could be very dangerous.  Provided patient with low-cost dental clinic information as well as urgent tooth information with Dr. Althea Charon.  Patient verbalizes understanding and agrees to treatment plan.  Patient instructed that if he is not improving over the next 2 to 3 days he can follow-up with our office or his primary care. Final Clinical Impressions(s) / UC Diagnoses   Final diagnoses:  Pain, dental  Infected dental caries  Open fracture of tooth, initial encounter  Gingivitis  Poor dentition     Discharge Instructions     You likely have an abscess on your left lower jaw. I would recommend that you follow up with a dentist for possible removal of broken teeth.  Take the medication as prescribed, three times daily for 7 days (Clindamycin). You may take the Naproxen twice daily as needed for pain.  Follow up as needed.    ED Prescriptions    Medication Sig Dispense Auth. Provider   clindamycin (CLEOCIN) 150 MG capsule Take 1 capsule (150 mg total) by mouth 3 (three) times daily for 7 days. 21 capsule Moshe Cipro, NP   naproxen (NAPROSYN) 500 MG tablet Take 1 tablet (500 mg total) by mouth 2 (two) times daily. 30 tablet Moshe Cipro, NP     I have reviewed the PDMP during this encounter.   Moshe Cipro, NP 07/22/19 2329

## 2019-07-19 NOTE — Discharge Instructions (Addendum)
You likely have an abscess on your left lower jaw. I would recommend that you follow up with a dentist for possible removal of broken teeth.  Take the medication as prescribed, three times daily for 7 days (Clindamycin). You may take the Naproxen twice daily as needed for pain.  Follow up as needed.

## 2019-07-19 NOTE — ED Triage Notes (Signed)
Pt presents with left side dental pain for past few days. °

## 2019-08-16 ENCOUNTER — Other Ambulatory Visit: Payer: Self-pay

## 2019-08-16 ENCOUNTER — Emergency Department (HOSPITAL_COMMUNITY)
Admission: EM | Admit: 2019-08-16 | Discharge: 2019-08-16 | Disposition: A | Discharge status: Home / Self Care | Payer: Self-pay | Attending: Emergency Medicine | Admitting: Emergency Medicine

## 2019-08-16 ENCOUNTER — Ambulatory Visit (HOSPITAL_COMMUNITY)
Admission: EM | Admit: 2019-08-16 | Discharge: 2019-08-16 | Disposition: A | Discharge status: Home / Self Care | Payer: Self-pay | Attending: Family Medicine | Admitting: Family Medicine

## 2019-08-16 ENCOUNTER — Emergency Department (HOSPITAL_COMMUNITY): Payer: Self-pay

## 2019-08-16 ENCOUNTER — Encounter (HOSPITAL_COMMUNITY): Payer: Self-pay

## 2019-08-16 DIAGNOSIS — K760 Fatty (change of) liver, not elsewhere classified: Secondary | ICD-10-CM

## 2019-08-16 DIAGNOSIS — F17210 Nicotine dependence, cigarettes, uncomplicated: Secondary | ICD-10-CM | POA: Insufficient documentation

## 2019-08-16 DIAGNOSIS — K920 Hematemesis: Secondary | ICD-10-CM

## 2019-08-16 DIAGNOSIS — R7401 Elevation of levels of liver transaminase levels: Secondary | ICD-10-CM | POA: Insufficient documentation

## 2019-08-16 DIAGNOSIS — K700 Alcoholic fatty liver: Secondary | ICD-10-CM | POA: Insufficient documentation

## 2019-08-16 DIAGNOSIS — R1011 Right upper quadrant pain: Secondary | ICD-10-CM

## 2019-08-16 DIAGNOSIS — R109 Unspecified abdominal pain: Secondary | ICD-10-CM

## 2019-08-16 LAB — URINALYSIS, ROUTINE W REFLEX MICROSCOPIC
Bilirubin Urine: NEGATIVE
Glucose, UA: NEGATIVE mg/dL
Hgb urine dipstick: NEGATIVE
Ketones, ur: 80 mg/dL — AB
Leukocytes,Ua: NEGATIVE
Nitrite: NEGATIVE
Protein, ur: 30 mg/dL — AB
Specific Gravity, Urine: 1.025 (ref 1.005–1.030)
pH: 5 (ref 5.0–8.0)

## 2019-08-16 LAB — COMPREHENSIVE METABOLIC PANEL
ALT: 654 U/L — ABNORMAL HIGH (ref 0–44)
AST: 328 U/L — ABNORMAL HIGH (ref 15–41)
Albumin: 4.9 g/dL (ref 3.5–5.0)
Alkaline Phosphatase: 79 U/L (ref 38–126)
Anion gap: 12 (ref 5–15)
BUN: 10 mg/dL (ref 6–20)
CO2: 22 mmol/L (ref 22–32)
Calcium: 9.6 mg/dL (ref 8.9–10.3)
Chloride: 105 mmol/L (ref 98–111)
Creatinine, Ser: 0.91 mg/dL (ref 0.61–1.24)
GFR calc Af Amer: >60 mL/min (ref >60)
GFR calc non Af Amer: >60 mL/min (ref >60)
Glucose, Bld: 92 mg/dL (ref 70–99)
Potassium: 3.9 mmol/L (ref 3.5–5.1)
Sodium: 139 mmol/L (ref 135–145)
Total Bilirubin: 1.7 mg/dL — ABNORMAL HIGH (ref 0.3–1.2)
Total Protein: 8.5 g/dL — ABNORMAL HIGH (ref 6.5–8.1)

## 2019-08-16 LAB — CBC
HCT: 57.8 % — ABNORMAL HIGH (ref 39.0–52.0)
Hemoglobin: 19.4 g/dL — ABNORMAL HIGH (ref 13.0–17.0)
MCH: 32.5 pg (ref 26.0–34.0)
MCHC: 33.6 g/dL (ref 30.0–36.0)
MCV: 96.8 fL (ref 80.0–100.0)
Platelets: 198 10*3/uL (ref 150–400)
RBC: 5.97 MIL/uL — ABNORMAL HIGH (ref 4.22–5.81)
RDW: 12.8 % (ref 11.5–15.5)
WBC: 9.3 10*3/uL (ref 4.0–10.5)
nRBC: 0 % (ref 0.0–0.2)

## 2019-08-16 LAB — PROTIME-INR
INR: 1.1 (ref 0.8–1.2)
Prothrombin Time: 13.9 seconds (ref 11.4–15.2)

## 2019-08-16 LAB — LIPASE, BLOOD: Lipase: 24 U/L (ref 11–51)

## 2019-08-16 MED ORDER — ONDANSETRON 4 MG PO TBDP
ORAL_TABLET | ORAL | Status: AC
  Filled 2019-08-16: qty 1

## 2019-08-16 MED ORDER — ONDANSETRON 4 MG PO TBDP
4.0000 mg | ORAL_TABLET | Freq: Once | ORAL | Status: AC
  Administered 2019-08-16: 19:00:00 4 mg via ORAL

## 2019-08-16 MED ORDER — FENTANYL CITRATE (PF) 100 MCG/2ML IJ SOLN
100.0000 ug | Freq: Once | INTRAMUSCULAR | Status: AC
  Administered 2019-08-16: 100 ug via INTRAVENOUS
  Filled 2019-08-16: qty 2

## 2019-08-16 MED ORDER — PANTOPRAZOLE SODIUM 20 MG PO TBEC
20.0000 mg | DELAYED_RELEASE_TABLET | Freq: Every day | ORAL | 0 refills | Status: AC
Start: 2019-08-16 — End: 2019-09-15

## 2019-08-16 MED ORDER — ONDANSETRON HCL 4 MG/2ML IJ SOLN
4.0000 mg | Freq: Once | INTRAMUSCULAR | Status: AC
  Administered 2019-08-16: 4 mg via INTRAVENOUS
  Filled 2019-08-16: qty 2

## 2019-08-16 MED ORDER — SODIUM CHLORIDE 0.9% FLUSH
3.0000 mL | Freq: Once | INTRAVENOUS | Status: AC
  Administered 2019-08-16: 3 mL via INTRAVENOUS

## 2019-08-16 NOTE — Discharge Instructions (Signed)
As we discussed, your blood work did show some elevations of your liver enzymes. Your other tests looked reassuring. We have added on hepatitis panel that will take a few days to come back. If you are positive, you will be notified. You can also check these results via MyChart.  Your ultrasound did not show any evidence of infection of your gallbladder. Your liver was mildly enlarged. This most likely is from your alcohol use.  As we discussed, you will need to follow-up with GI.  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 Take Protonix as directed.  Follow-up with [facility] to establish a primary care doctor if you do not have one.   Return to the Emergency Department immediately if you experience any worsening abdominal pain, fever, persistent nausea and vomiting, persistent vomiting blood, inability keep any food down, pain with urination, blood in your urine or any other worsening or concerning symptoms.

## 2019-08-16 NOTE — ED Notes (Signed)
Pt lying in bed. Denies any needs. Pt ready for d/c

## 2019-08-16 NOTE — ED Triage Notes (Signed)
Pt reports dull RUQ pain for the past few weeks. States that this morning, he vomited bright red blood about 2 hours after eating a meal. Reports that he usually drinks about 4 mixed drinks and a couple beers a day. A&Ox4.

## 2019-08-16 NOTE — ED Notes (Signed)
Pt sitting up in bed. Warm blanket given. Will continue to monitor. Pt denies any needs.

## 2019-08-16 NOTE — ED Provider Notes (Signed)
[facility]    CSN: [id number] Arrival date & time: 08/16/19  1729      History   Chief Complaint Chief Complaint  Patient presents with  . Abdominal Pain    HPI Joshua Lin is a 34 y.o. male.   HPI  Patient presents here today for evaluation of  Evaluation of RUQ abdominal pain with one episode of hematemesis. RUQ has occurred persistently over the last several weeks. The pain is aching and cramping and varies from mild intensity to moderate level of pain. He reports following work he ate hot dogs and one hour later vomited blood. Patient provided a photo of vomitus which was a significant amount of hematemesis. He slept most of today. Currently experiencing mild RUQ pain. He is afebrile.   Past Medical History:  Diagnosis Date  . Tourette disease     There are no problems to display for this patient.   No past surgical history on file.     Home Medications    Prior to Admission medications   Medication Sig Start Date End Date Taking? Authorizing Provider  busPIRone (BUSPAR) 15 MG tablet Take 15 mg by mouth 3 (three) times daily. 08/19/18   [provider]  escitalopram (LEXAPRO) 10 MG tablet TAKE 1 TABLET BY MOUTH EVERY DAY IN THE MORNING 08/19/18   [provider]  naproxen (NAPROSYN) 500 MG tablet Take 1 tablet (500 mg total) by mouth 2 (two) times daily. 07/19/19   Moshe Cipro, NP    Family History Family History  Problem Relation Age of Onset  . Asthma Mother   . Healthy Father     Social History Social History   Tobacco Use  . Smoking status: Current Every Day Smoker    Packs/day: 1.00  . Smokeless tobacco: Never Used  Substance Use Topics  . Alcohol use: Yes  . Drug use: Yes    Types: Marijuana     Allergies   Sulfa antibiotics   Review of Systems Review of Systems Pertinent negatives listed in HPI  Physical Exam Triage Vital Signs ED Triage Vitals [08/16/19 1756]  Enc Vitals Group     BP (!) 170/94       Pulse Rate 85     Resp 16     Temp 98 F (36.7 C)     Temp src      SpO2 97 %     Weight      Height      Head Circumference      Peak Flow      Pain Score 3     Pain Loc      Pain Edu?      Excl. in GC?    No data found.  Updated Vital Signs BP (!) 170/94   Pulse 85   Temp 98 F (36.7 C)   Resp 16   SpO2 97%   Visual Acuity Right Eye Distance:   Left Eye Distance:   Bilateral Distance:    Right Eye Near:   Left Eye Near:    Bilateral Near:     Physical Exam Cardiovascular:     Rate and Rhythm: Normal rate and regular rhythm.  Pulmonary:     Effort: Pulmonary effort is normal.     Breath sounds: Normal breath sounds.  Abdominal:     General: Abdomen is flat. Bowel sounds are normal.     Palpations: Abdomen is soft.     Tenderness: There is abdominal tenderness in  the right upper quadrant. There is no right CVA tenderness or guarding. Negative signs include Murphy's sign.  Skin:    General: Skin is warm.     Capillary Refill: Capillary refill takes less than 2 seconds.  Psychiatric:        Attention and Perception: Attention normal.        Mood and Affect: Mood normal.        Speech: Speech normal.    UC Treatments / Results  Labs (all labs ordered are listed, but only abnormal results are displayed) Labs Reviewed - No data to display  EKG   Radiology No results found.  Procedures Procedures (including critical care time)  Medications Ordered in UC Medications  ondansetron (ZOFRAN-ODT) disintegrating tablet 4 mg (has no administration in time range)    Initial Impression / Assessment and Plan / UC Course  I have reviewed the triage vital signs and the nursing notes.  Pertinent labs & imaging results that were available during my care of the patient were reviewed by me and considered in my medical decision making (see chart for details).    Abdominal pain, RUQ Hematemesis with nausea Unknown etiology. Given symptoms, patient warrants  further evaluation to rule out a GI bleed or cholecystitis.  Zofran 4 given here for nausea. Patient agreed to follow-up at [facility] ER Final Clinical Impressions(s) / UC Diagnoses   Final diagnoses:  Abdominal pain, RUQ  Hematemesis with nausea     Discharge Instructions     Go immediately to the Emergency Department for further evaluation of the cause of your abdominal pain and recent episode of vomiting blood.    ED Prescriptions    None     PDMP not reviewed this encounter.   Scot Jun, FNP 08/18/19 0040

## 2019-08-16 NOTE — ED Notes (Signed)
Pt sitting up in bed on the phone. NAD noted. Water given. Will continue to monitor.

## 2019-08-16 NOTE — ED Provider Notes (Signed)
[place] DEPT Provider Note   CSN: [id number] Arrival date & time: 08/16/19  1908     History Chief Complaint  Patient presents with  . Hematemesis    Joshua Lin is a 34 y.o. male who presents for evaluation of abdominal pain and hematemesis.  He reports that over the last 2 to 3 weeks, he was having dull aching pain in his right upper quadrant.  He states that it was pretty constant but he would have some episodes where it would resolve.  He states that there is nothing that made it better or worse and it was not really affected by eating.  He states that he has had some nausea but denies any vomiting.  He states he ate dinner last night at about 1 AM.  He states that he felt nauseous but then reports that he tried to sleep it off.  At about 3 AM, he woke up had an episode of vomiting.  He reports that he vomited up all of his food and then had an episode of bright red blood.  He has not had any since then.  He reports he continues to have right upper quadrant pain though he states that this does not change in severity or intensity.  He has not taken any medications for the pain.  His last bowel movement was earlier today and was normal.  No presence of blood noted.  He went to urgent care who evaluated him and sent him to the emergency department for further evaluation.  He is not on blood thinners.  He has no previous history of NSAID use.  He does smoke.  He also reports that he drinks alcohol daily.  He reports that he has about 3-4 mixed drinks a day as well as one beer.  He denies any fevers, chest pain, difficulty breathing, dysuria, hematuria, melena.   The history is provided by the patient.       Past Medical History:  Diagnosis Date  . Tourette disease     There are no problems to display for this patient.   History reviewed. No pertinent surgical history.     Family History  Problem Relation Age of Onset  . Asthma Mother   .  Healthy Father     Social History   Tobacco Use  . Smoking status: Current Every Day Smoker    Packs/day: 1.00  . Smokeless tobacco: Never Used  Substance Use Topics  . Alcohol use: Yes  . Drug use: Yes    Types: Marijuana    Home Medications Prior to Admission medications   Medication Sig Start Date End Date Taking? Authorizing Provider  busPIRone (BUSPAR) 15 MG tablet Take 15 mg by mouth 3 (three) times daily. 08/19/18  Yes [provider]  escitalopram (LEXAPRO) 10 MG tablet Take 10 mg by mouth daily.  08/19/18  Yes [provider]  naproxen (NAPROSYN) 500 MG tablet Take 1 tablet (500 mg total) by mouth 2 (two) times daily. 07/19/19  Yes Faustino Congress, NP  pantoprazole (PROTONIX) 20 MG tablet Take 1 tablet (20 mg total) by mouth daily. 08/16/19 09/15/19  Volanda Napoleon, PA-C    Allergies    Sulfa antibiotics  Review of Systems   Review of Systems  Constitutional: Negative for fever.  Respiratory: Negative for cough and shortness of breath.   Cardiovascular: Negative for chest pain.  Gastrointestinal: Positive for abdominal pain, nausea and vomiting. Negative for blood in stool.  Genitourinary: Negative  for dysuria and hematuria.  Neurological: Negative for headaches.  All other systems reviewed and are negative.   Physical Exam Updated Vital Signs BP (!) 133/96   Pulse 78   Temp 98.1 F (36.7 C) (Oral)   Resp 18   SpO2 96%   Physical Exam Vitals and nursing note reviewed.  Constitutional:      Appearance: Normal appearance. He is well-developed.     Comments: Sitting comfortably on examination table  HENT:     Head: Normocephalic and atraumatic.  Eyes:     General: Lids are normal.     Conjunctiva/sclera: Conjunctivae normal.     Pupils: Pupils are equal, round, and reactive to light.  Cardiovascular:     Rate and Rhythm: Normal rate and regular rhythm.     Pulses: Normal pulses.     Heart sounds: Normal heart sounds. No murmur. No  friction rub. No gallop.   Pulmonary:     Effort: Pulmonary effort is normal.     Breath sounds: Normal breath sounds.  Abdominal:     Palpations: Abdomen is soft. Abdomen is not rigid.     Tenderness: There is abdominal tenderness in the right upper quadrant. There is no guarding or rebound.     Comments: Abdomen soft, nondistended.  Tenderness noted focally to the right upper quadrant.  No rigidity, guarding.  No CVA tenderness noted bilaterally.  Musculoskeletal:        General: Normal range of motion.     Cervical back: Full passive range of motion without pain.  Skin:    General: Skin is warm and dry.     Capillary Refill: Capillary refill takes less than 2 seconds.  Neurological:     Mental Status: He is alert and oriented to person, place, and time.  Psychiatric:        Speech: Speech normal.     ED Results / Procedures / Treatments   Labs (all labs ordered are listed, but only abnormal results are displayed) Labs Reviewed  COMPREHENSIVE METABOLIC PANEL - Abnormal; Notable for the following components:      Result Value   Total Protein 8.5 (*)    AST 328 (*)    ALT 654 (*)    Total Bilirubin 1.7 (*)    All other components within normal limits  CBC - Abnormal; Notable for the following components:   RBC 5.97 (*)    Hemoglobin 19.4 (*)    HCT 57.8 (*)    All other components within normal limits  URINALYSIS, ROUTINE W REFLEX MICROSCOPIC - Abnormal; Notable for the following components:   Color, Urine AMBER (*)    Ketones, ur 80 (*)    Protein, ur 30 (*)    Bacteria, UA RARE (*)    All other components within normal limits  LIPASE, BLOOD  PROTIME-INR  HEPATITIS PANEL, ACUTE    EKG None  Radiology US Abdomen Limited RUQ  Result Date: 08/16/2019 CLINICAL DATA:  Abdominal pain x2 weeks. EXAM: ULTRASOUND ABDOMEN LIMITED RIGHT UPPER QUADRANT COMPARISON:  None. FINDINGS: Gallbladder: No gallstones or wall thickening visualized (2.4 mm). No sonographic Murphy sign  noted by sonographer. Common bile duct: Diameter: 4.0 mm Liver: No focal lesion identified. Diffusely increased echogenicity of the liver parenchyma is noted. Portal vein is patent on color Doppler imaging with normal direction of blood flow towards the liver. Other: None. IMPRESSION: 1. Fatty liver. 2. No evidence of cholelithiasis or acute cholecystitis. Electronically Signed   By: Virgina Norfolk  M.D.   On: 08/16/2019 22:30    Procedures Procedures (including critical care time)  Medications Ordered in ED Medications  sodium chloride flush (NS) 0.9 % injection 3 mL (3 mLs Intravenous Given 08/16/19 2106)  fentaNYL (SUBLIMAZE) injection 100 mcg (100 mcg Intravenous Given 08/16/19 2105)  ondansetron (ZOFRAN) injection 4 mg (4 mg Intravenous Given 08/16/19 2105)    ED Course  I have reviewed the triage vital signs and the nursing notes.  Pertinent labs & imaging results that were available during my care of the patient were reviewed by me and considered in my medical decision making (see chart for details).    MDM Rules/Calculators/A&P                      34 year old male who presents for evaluation of abdominal pain and episode of hematemesis.  Reports abdominal pain has been ongoing for the last few weeks.  1 episode of hematemesis earlier today.  Went to urgent care and was evaluated and told to come to the emergency department.  Does report that he is a daily alcohol drinker.  No blood thinner use.  He also smokes.  On initial ED arrival, he is afebrile, nontoxic-appearing.  On exam, he does have right upper quadrant abdominal tenderness.  Concern for hepatobiliary etiology.  Plan for labs, right upper quadrant ultrasound for evaluation of any hepatobiliary abnormality.  CBC shows no leukocytosis, hemoglobin of 19.4.  UA negative for any infectious etiology. CMP shows AST of 328 and ALT of 654. Alk Phos is 79. T bili is 1.7. No priors for comparison. Lipase is normal.  INR is  1.1.  Ultrasound shows evidence of fatty liver.  No evidence of cholelithiasis or cholecystitis.  Reevaluation. Patient is resting comfortably. He has not had any more vomiting here in the ED. He has been able tolerate p.o. without any difficulty. I discussed with him regarding his results. He has not had any recent travel outside the country and denies any history of IV drug use. He has not taken any Tylenol. Hepatitis panel sent. I discussed with him that this is most likely due to alcohol. We will plan to give him outpatient GI follow-up. Additionally, there may be a component of gastritis that was contributing to his hematemesis that was earlier today. He has not had any more episodes and is resting comfortably. Do not feel that he warrants admission. Discussed patient with Dr. Autumn Messing who agrees with plan. Patient had ample opportunity for questions and discussion. All patient's questions were answered with full understanding. Strict return precautions discussed. Patient expresses understanding and agreement to plan.   Portions of this note were generated with Lobbyist. Dictation errors may occur despite best attempts at proofreading.   Final Clinical Impression(s) / ED Diagnoses Final diagnoses:  Abdominal pain  Hematemesis, presence of nausea not specified  Fatty liver  Transaminitis    Rx / DC Orders ED Discharge Orders         Ordered    pantoprazole (PROTONIX) 20 MG tablet  Daily     08/16/19 2307           Volanda Napoleon, PA-C 08/16/19 2310    Lennice Sites, DO 08/16/19 2340

## 2019-08-16 NOTE — Discharge Instructions (Addendum)
Go immediately to the Emergency Department for further evaluation of the cause of your abdominal pain and recent episode of vomiting blood.

## 2019-08-16 NOTE — ED Triage Notes (Signed)
RUQ abd pain x 2-3 weeks, 1 episode of vomiting with bright red blood this AM

## 2019-08-17 LAB — HEPATITIS PANEL, ACUTE
HCV Ab: REACTIVE — AB
Hep A IgM: NONREACTIVE
Hep B C IgM: NONREACTIVE
Hepatitis B Surface Ag: NONREACTIVE

## 2019-09-22 ENCOUNTER — Ambulatory Visit: Payer: Self-pay | Admitting: Family Medicine

## 2019-09-23 DIAGNOSIS — F410 Panic disorder [episodic paroxysmal anxiety] without agoraphobia: Secondary | ICD-10-CM | POA: Insufficient documentation

## 2019-09-23 DIAGNOSIS — F4010 Social phobia, unspecified: Secondary | ICD-10-CM | POA: Insufficient documentation

## 2019-09-23 DIAGNOSIS — F329 Major depressive disorder, single episode, unspecified: Secondary | ICD-10-CM | POA: Insufficient documentation

## 2019-09-23 NOTE — Progress Notes (Signed)
Psychiatric Initial Adult Assessment   Virtual Visit via Video Note   I connected with Joshua Lin on 09/24/19 by a video enabled telemedicine application and verified that I am speaking with the correct person using two identifiers.   Location: Patient: Home Provider: [facility] home office   I discussed the limitations of evaluation and management by telemedicine and the availability of in person appointments. The patient expressed understanding and agreed to proceed.   I provided 60 minutes of non-face-to-face time during this encounter.   Patient Identification: Joshua Lin MRN:  [id number] Date of Evaluation:  09/23/2019 Referral Source: [facility] Chief Complaint:  "Im doing really good on my meds" Visit Diagnosis:    ICD-10-CM   1. Moderate episode of recurrent major depressive disorder (HCC)  F33.1   2. Social phobia  F40.10   3. Panic disorder (episodic paroxysmal anxiety)  F41.0     History of Present Illness:  Joshua Lin is a 34 year old  male  seen today for initial psych evaluation.  Patient was referred to outpatient psychiatry by [facility] for medication management.  On assessment Joshua Lin states that he has struggled with anxiety and depression for years without relief. He states that he finally feels better but he Is worried about refills because he does not want to run out of medication.  He reports good sleep, good appetite, and stable moods.  He denies SI, HI, and AVH. There is no evidence of delusional thought processes or responding to internal stimuli.  Associated Signs/Symptoms: Depression Symptoms:  anxiety, (Hypo) Manic Symptoms:  na Anxiety Symptoms:  Social Anxiety, Psychotic Symptoms:  na PTSD Symptoms: NA  Past Psychiatric History:        -old    seen today for initial psych evaluation.  Patient was referred to outpatient psychiatry by [facility] for medication management.  Previous Psychotropic Medications: No   Substance Abuse History in the  last 12 months:  No.  Consequences of Substance Abuse: NA  Past Medical History:  Past Medical History:  Diagnosis Date  . Tourette disease    No past surgical history on file.  Family Psychiatric History: unknown  Family History:  Family History  Problem Relation Age of Onset  . Asthma Mother   . Healthy Father     Social History:   Social History   Socioeconomic History  . Marital status: Single    Spouse name: Not on file  . Number of children: Not on file  . Years of education: Not on file  . Highest education level: Not on file  Occupational History  . Not on file  Tobacco Use  . Smoking status: Current Every Day Smoker    Packs/day: 1.00  . Smokeless tobacco: Never Used  Substance and Sexual Activity  . Alcohol use: Yes  . Drug use: Yes    Types: Marijuana  . Sexual activity: Not on file  Other Topics Concern  . Not on file  Social History Narrative  . Not on file   Social Determinants of Health   Financial Resource Strain:   . Difficulty of Paying Living Expenses:   Food Insecurity:   . Worried About Charity fundraiser in the Last Year:   . Arboriculturist in the Last Year:   Transportation Needs:   . Film/video editor (Medical):   Marland Kitchen Lack of Transportation (Non-Medical):   Physical Activity:   . Days of Exercise per Week:   . Minutes of Exercise per Session:  Stress:   . Feeling of Stress :   Social Connections:   . Frequency of Communication with Friends and Family:   . Frequency of Social Gatherings with Friends and Family:   . Attends Religious Services:   . Active Member of Clubs or Organizations:   . Attends Banker Meetings:   Marland Kitchen Marital Status:     Additional Social History: unknown  Allergies:   Allergies  Allergen Reactions  . Sulfa Antibiotics Other (See Comments)    unknown    Metabolic Disorder Labs: No results found for: HGBA1C, MPG No results found for: PROLACTIN No results found for: CHOL, TRIG,  HDL, CHOLHDL, VLDL, LDLCALC No results found for: TSH  Therapeutic Level Labs: No results found for: LITHIUM No results found for: CBMZ No results found for: VALPROATE  Current Medications: Current Outpatient Medications  Medication Sig Dispense Refill  . busPIRone (BUSPAR) 15 MG tablet Take 15 mg by mouth 3 (three) times daily.    Marland Kitchen escitalopram (LEXAPRO) 10 MG tablet Take 10 mg by mouth daily.     . naproxen (NAPROSYN) 500 MG tablet Take 1 tablet (500 mg total) by mouth 2 (two) times daily. 30 tablet 0  . pantoprazole (PROTONIX) 20 MG tablet Take 1 tablet (20 mg total) by mouth daily. 30 tablet 0   No current facility-administered medications for this visit.    Musculoskeletal: Strength & Muscle Tone: within normal limits Gait & Station: normal Patient leans: N/A  Psychiatric Specialty Exam: Review of Systems  Psychiatric/Behavioral: Negative for decreased concentration, self-injury and suicidal ideas. The patient is not nervous/anxious.   All other systems reviewed and are negative.   There were no vitals taken for this visit.There is no height or weight on file to calculate BMI.  General Appearance: Casual  Eye Contact:  Good  Speech:  Clear and Coherent  Volume:  Normal  Mood:  Anxious and Euthymic  Affect:  Congruent  Thought Process:  Coherent and Descriptions of Associations: Intact  Orientation:  Full (Time, Place, and Person)  Thought Content:  WDL  Suicidal Thoughts:  No  Homicidal Thoughts:  No  Memory:  Recent;   Fair  Judgement:  Intact  Insight:  Good  Psychomotor Activity:  Normal  Concentration:  Concentration: Good  Recall:  Good  Fund of Knowledge:Good  Language: Good  Akathisia:  NA  Handed:  Right  AIMS (if indicated):  not done  Assets:  Communication Skills Desire for Improvement Financial Resources/Insurance Housing Social Support Vocational/Educational  ADL's:  Intact  Cognition: WNL  Sleep:  Good   Screenings:   Assessment and  Plan:  Patient appears to be stable on her current regimen.Will continue regime as follows:  1. MDD (major depressive disorder), recurrent, in full remission (HCC)  escitalopram (LEXAPRO) 10 MG tablet 10 mg, Daily  2. Anxiety  busPIRone (BUSPAR) 15 MG tablet 15 mg, 3 times daily   Follow-up in 3 months.  Jearld Lesch, NP 6/8/202111:14 PM

## 2019-09-24 ENCOUNTER — Other Ambulatory Visit: Payer: Self-pay

## 2019-09-24 ENCOUNTER — Telehealth (INDEPENDENT_AMBULATORY_CARE_PROVIDER_SITE_OTHER): Payer: No Payment, Other | Admitting: Psychiatric/Mental Health

## 2019-09-24 ENCOUNTER — Encounter (HOSPITAL_COMMUNITY): Payer: Self-pay | Admitting: Psychiatric/Mental Health

## 2019-09-24 DIAGNOSIS — F410 Panic disorder [episodic paroxysmal anxiety] without agoraphobia: Secondary | ICD-10-CM

## 2019-09-24 DIAGNOSIS — F4010 Social phobia, unspecified: Secondary | ICD-10-CM

## 2019-09-24 DIAGNOSIS — F331 Major depressive disorder, recurrent, moderate: Secondary | ICD-10-CM | POA: Diagnosis not present

## 2019-09-24 MED ORDER — BUSPIRONE HCL 15 MG PO TABS: 15.0000 mg | ORAL_TABLET | Freq: Three times a day (TID) | ORAL | 3 refills | Status: DC

## 2019-09-24 MED ORDER — ESCITALOPRAM OXALATE 10 MG PO TABS: 10.0000 mg | ORAL_TABLET | Freq: Every day | ORAL | 3 refills | Status: DC

## 2019-12-24 ENCOUNTER — Other Ambulatory Visit (HOSPITAL_COMMUNITY): Payer: Self-pay | Admitting: Psychiatry

## 2019-12-24 DIAGNOSIS — F4010 Social phobia, unspecified: Secondary | ICD-10-CM

## 2019-12-24 DIAGNOSIS — F410 Panic disorder [episodic paroxysmal anxiety] without agoraphobia: Secondary | ICD-10-CM

## 2019-12-24 DIAGNOSIS — F331 Major depressive disorder, recurrent, moderate: Secondary | ICD-10-CM

## 2019-12-24 MED ORDER — BUSPIRONE HCL 15 MG PO TABS: 15.0000 mg | ORAL_TABLET | Freq: Three times a day (TID) | ORAL | 1 refills | Status: AC

## 2019-12-24 MED ORDER — ESCITALOPRAM OXALATE 10 MG PO TABS: 10.0000 mg | ORAL_TABLET | Freq: Every day | ORAL | 1 refills | Status: AC

## 2019-12-25 ENCOUNTER — Telehealth (HOSPITAL_COMMUNITY): Payer: No Payment, Other

## 2019-12-25 ENCOUNTER — Other Ambulatory Visit: Payer: Self-pay
